# Patient Record
Sex: Female | Born: 1970
Health system: Southern US, Community
[De-identification: ages and names within clinical notes are randomized; demographics above are authoritative.]

## PROBLEM LIST (undated history)

## (undated) DIAGNOSIS — R011 Cardiac murmur, unspecified: Secondary | ICD-10-CM

## (undated) DIAGNOSIS — U071 COVID-19: Secondary | ICD-10-CM

## (undated) DIAGNOSIS — I10 Essential (primary) hypertension: Secondary | ICD-10-CM

## (undated) DIAGNOSIS — O24419 Gestational diabetes mellitus in pregnancy, unspecified control: Secondary | ICD-10-CM

## (undated) HISTORY — DX: Gestational diabetes mellitus in pregnancy, unspecified control: O24.419

## (undated) HISTORY — PX: OTHER SURGICAL HISTORY: SHX169

## (undated) HISTORY — DX: Essential (primary) hypertension: I10

## (undated) HISTORY — DX: Cardiac murmur, unspecified: R01.1

## (undated) HISTORY — PX: ABDOMINAL HYSTERECTOMY: SHX81

## (undated) HISTORY — DX: COVID-19: U07.1

---

## 2004-01-28 ENCOUNTER — Ambulatory Visit: Payer: Self-pay | Admitting: Family Medicine

## 2004-02-01 ENCOUNTER — Ambulatory Visit: Payer: Self-pay | Admitting: Family Medicine

## 2007-09-13 ENCOUNTER — Ambulatory Visit: Payer: Self-pay | Admitting: Obstetrics & Gynecology

## 2008-07-06 ENCOUNTER — Ambulatory Visit: Payer: Self-pay | Admitting: Obstetrics & Gynecology

## 2008-07-12 ENCOUNTER — Ambulatory Visit: Payer: Self-pay | Admitting: Obstetrics & Gynecology

## 2009-11-18 ENCOUNTER — Ambulatory Visit: Payer: Self-pay | Admitting: Obstetrics & Gynecology

## 2015-10-08 ENCOUNTER — Other Ambulatory Visit: Payer: Self-pay | Admitting: Obstetrics & Gynecology

## 2015-10-08 DIAGNOSIS — Z1231 Encounter for screening mammogram for malignant neoplasm of breast: Secondary | ICD-10-CM

## 2015-10-28 ENCOUNTER — Other Ambulatory Visit: Payer: Self-pay | Admitting: Obstetrics & Gynecology

## 2015-10-28 ENCOUNTER — Ambulatory Visit
Admission: RE | Admit: 2015-10-28 | Discharge: 2015-10-28 | Disposition: A | Discharge status: Home / Self Care | Payer: 59 | Source: Ambulatory Visit | Attending: Obstetrics & Gynecology | Admitting: Obstetrics & Gynecology

## 2015-10-28 DIAGNOSIS — Z1231 Encounter for screening mammogram for malignant neoplasm of breast: Secondary | ICD-10-CM | POA: Insufficient documentation

## 2017-10-04 ENCOUNTER — Ambulatory Visit (INDEPENDENT_AMBULATORY_CARE_PROVIDER_SITE_OTHER): Payer: 59 | Admitting: Obstetrics & Gynecology

## 2017-10-04 ENCOUNTER — Encounter: Payer: Self-pay | Admitting: Obstetrics & Gynecology

## 2017-10-04 VITALS — BP 138/90 | Ht 64.0 in | Wt 238.0 lb

## 2017-10-04 DIAGNOSIS — Z13220 Encounter for screening for lipoid disorders: Secondary | ICD-10-CM | POA: Diagnosis not present

## 2017-10-04 DIAGNOSIS — Z1329 Encounter for screening for other suspected endocrine disorder: Secondary | ICD-10-CM | POA: Diagnosis not present

## 2017-10-04 DIAGNOSIS — Z1321 Encounter for screening for nutritional disorder: Secondary | ICD-10-CM

## 2017-10-04 DIAGNOSIS — Z1239 Encounter for other screening for malignant neoplasm of breast: Secondary | ICD-10-CM

## 2017-10-04 DIAGNOSIS — Z131 Encounter for screening for diabetes mellitus: Secondary | ICD-10-CM

## 2017-10-04 DIAGNOSIS — Z1231 Encounter for screening mammogram for malignant neoplasm of breast: Secondary | ICD-10-CM

## 2017-10-04 DIAGNOSIS — Z124 Encounter for screening for malignant neoplasm of cervix: Secondary | ICD-10-CM

## 2017-10-04 DIAGNOSIS — Z6841 Body Mass Index (BMI) 40.0 and over, adult: Secondary | ICD-10-CM | POA: Insufficient documentation

## 2017-10-04 DIAGNOSIS — E66813 Obesity, class 3: Secondary | ICD-10-CM

## 2017-10-04 DIAGNOSIS — Z0000 Encounter for general adult medical examination without abnormal findings: Secondary | ICD-10-CM

## 2017-10-04 MED ORDER — PHENTERMINE HCL 37.5 MG PO TABS: 37.5000 mg | ORAL_TABLET | Freq: Every day | ORAL | 0 refills | Status: DC

## 2017-10-04 NOTE — Patient Instructions (Addendum)
PAP every three years Mammogram every year    Call [phone] to schedule at [place] Colonoscopy every 10 years Labs yearly (with PCP)   Phentermine tablets or capsules What is this medicine? PHENTERMINE (FEN ter meen) decreases your appetite. It is used with a reduced calorie diet and exercise to help you lose weight. This medicine may be used for other purposes; ask your health care provider or pharmacist if you have questions. COMMON BRAND NAME(S): Adipex-P, Atti-Plex P, Atti-Plex P Spansule, Fastin, Lomaira, Pro-Fast, Tara-8 What should I tell my health care provider before I take this medicine? They need to know if you have any of these conditions: -agitation -glaucoma -heart disease -high blood pressure -history of substance abuse -lung disease called Primary Pulmonary Hypertension (PPH) -taken an MAOI like Carbex, Eldepryl, Marplan, Nardil, or Parnate in last 14 days -thyroid disease -an unusual or allergic reaction to phentermine, other medicines, foods, dyes, or preservatives -pregnant or trying to get pregnant -breast-feeding How should I use this medicine? Take this medicine by mouth with a glass of water. Follow the directions on the prescription label. The instructions for use may differ based on the product and dose you are taking. Avoid taking this medicine in the evening. It may interfere with sleep. Take your doses at regular intervals. Do not take your medicine more often than directed. Talk to your pediatrician regarding the use of this medicine in children. While this drug may be prescribed for children 17 years or older for selected conditions, precautions do apply. Overdosage: If you think you have taken too much of this medicine contact a poison control center or emergency room at once. NOTE: This medicine is only for you. Do not share this medicine with others. What if I miss a dose? If you miss a dose, take it as soon as you can. If it is almost time for  your next dose, take only that dose. Do not take double or extra doses. What may interact with this medicine? Do not take this medicine with any of the following medications: -duloxetine -MAOIs like Carbex, Eldepryl, Marplan, Nardil, and Parnate -medicines for colds or breathing difficulties like pseudoephedrine or phenylephrine -procarbazine -sibutramine -SSRIs like citalopram, escitalopram, fluoxetine, fluvoxamine, paroxetine, and sertraline -stimulants like dexmethylphenidate, methylphenidate or modafinil -venlafaxine This medicine may also interact with the following medications: -medicines for diabetes This list may not describe all possible interactions. Give your health care provider a list of all the medicines, herbs, non-prescription drugs, or dietary supplements you use. Also tell them if you smoke, drink alcohol, or use illegal drugs. Some items may interact with your medicine. What should I watch for while using this medicine? Notify your physician immediately if you become short of breath while doing your normal activities. Do not take this medicine within 6 hours of bedtime. It can keep you from getting to sleep. Avoid drinks that contain caffeine and try to stick to a regular bedtime every night. This medicine was intended to be used in addition to a healthy diet and exercise. The best results are achieved this way. This medicine is only indicated for short-term use. Eventually your weight loss may level out. At that point, the drug will only help you maintain your new weight. Do not increase or in any way change your dose without consulting your doctor. You may get drowsy or dizzy. Do not drive, use machinery, or do anything that needs mental alertness until you know how this medicine affects you. Do not stand or sit   up quickly, especially if you are an older patient. This reduces the risk of dizzy or fainting spells. Alcohol may increase dizziness and drowsiness. Avoid alcoholic  drinks. What side effects may I notice from receiving this medicine? Side effects that you should report to your doctor or health care professional as soon as possible: -chest pain, palpitations -depression or severe changes in mood -increased blood pressure -irritability -nervousness or restlessness -severe dizziness -shortness of breath -problems urinating -unusual swelling of the legs -vomiting Side effects that usually do not require medical attention (report to your doctor or health care professional if they continue or are bothersome): -blurred vision or other eye problems -changes in sexual ability or desire -constipation or diarrhea -difficulty sleeping -dry mouth or unpleasant taste -headache -nausea This list may not describe all possible side effects. Call your doctor for medical advice about side effects. You may report side effects to FDA at 1-800-FDA-1088. Where should I keep my medicine? Keep out of the reach of children. This medicine can be abused. Keep your medicine in a safe place to protect it from theft. Do not share this medicine with anyone. Selling or giving away this medicine is dangerous and against the law. This medicine may cause accidental overdose and death if taken by other adults, children, or pets. Mix any unused medicine with a substance like cat litter or coffee grounds. Then throw the medicine away in a sealed container like a sealed bag or a coffee can with a lid. Do not use the medicine after the expiration date. Store at room temperature between 20 and 25 degrees C (68 and 77 degrees F). Keep container tightly closed. NOTE: This sheet is a summary. It may not cover all possible information. If you have questions about this medicine, talk to your doctor, pharmacist, or health care provider.  2018 Elsevier/Gold Standard (2015-01-18 12:53:15)  

## 2017-10-04 NOTE — Progress Notes (Signed)
HPI:      Donna Hinton is a 47 y.o. [id number] who LMP was in the past, she presents today for her annual examination.  The patient has no complaints today. Rare bleeding (has had [facility]).  No night sweats and rare hot flashes (just feels hot all the time). Weight gain also a concern, has been on meds in past w some help; feels now that she is on first shift work at [employer] she will do better.  The patient is sexually active. Herlast pap: approximate date 2016 and was normal and last mammogram: approximate date 2017 and was normal.  The patient does perform self breast exams.  There is notable family history of breast or ovarian cancer in her family. The patient is not taking hormone replacement therapy. Patient denies post-menopausal vaginal bleeding.   The patient has regular exercise: yes. The patient denies current symptoms of depression.    GYN Hx: Last Colonoscopy:never ago. Normal.  Last DEXA: never ago.    PMHx: Past Medical History:  Diagnosis Date  . Gestational diabetes    Past Surgical History:  Procedure Laterality Date  . lsh     Family History  Problem Relation Age of Onset  . Breast cancer Mother 22  . Diabetes Father   . Hypertension Father    Social History   Tobacco Use  . Smoking status: Never Smoker  . Smokeless tobacco: Never Used  Substance Use Topics  . Alcohol use: Never    Frequency: Never  . Drug use: Never    Current Outpatient Medications:  .  phentermine (ADIPEX-P) 37.5 MG tablet, Take 1 tablet (37.5 mg total) by mouth daily before breakfast., Disp: 30 tablet, Rfl: 0 Allergies: Sulfa antibiotics  Review of Systems  Constitutional: Negative for chills, fever and malaise/fatigue.  HENT: Negative for congestion, sinus pain and sore throat.   Eyes: Negative for blurred vision and pain.  Respiratory: Negative for cough and wheezing.   Cardiovascular: Negative for chest pain and leg swelling.  Gastrointestinal: Negative for abdominal pain,  constipation, diarrhea, heartburn, nausea and vomiting.  Genitourinary: Negative for dysuria, frequency, hematuria and urgency.  Musculoskeletal: Negative for back pain, joint pain, myalgias and neck pain.  Skin: Negative for itching and rash.  Neurological: Negative for dizziness, tremors and weakness.  Endo/Heme/Allergies: Does not bruise/bleed easily.  Psychiatric/Behavioral: Negative for depression. The patient is not nervous/anxious and does not have insomnia.     Objective: BP 138/90   Ht 5\' 4"  (1.626 m)   Wt 238 lb (108 kg)   BMI 40.85 kg/m   Filed Weights   10/04/17 0806  Weight: 238 lb (108 kg)   Body mass index is 40.85 kg/m. Physical Exam  Constitutional: She is oriented to person, place, and time. She appears well-developed and well-nourished. No distress.  Genitourinary: Rectum normal and vagina normal. Pelvic exam was performed with patient supine. There is no rash or lesion on the right labia. There is no rash or lesion on the left labia. Vagina exhibits no lesion. No bleeding in the vagina. Right adnexum does not display mass and does not display tenderness. Left adnexum does not display mass and does not display tenderness. Cervix does not exhibit motion tenderness, lesion, friability or polyp.  Genitourinary Comments: Uterus absent No mass  HENT:  Head: Normocephalic and atraumatic. Head is without laceration.  Right Ear: Hearing normal.  Left Ear: Hearing normal.  Nose: No epistaxis.  No foreign bodies.  Mouth/Throat: Uvula is midline, oropharynx  is clear and moist and mucous membranes are normal.  Eyes: Pupils are equal, round, and reactive to light.  Neck: Normal range of motion. Neck supple. No thyromegaly present.  Cardiovascular: Normal rate and regular rhythm. Exam reveals no gallop and no friction rub.  No murmur heard. Pulmonary/Chest: Effort normal and breath sounds normal. No respiratory distress. She has no wheezes. Right breast exhibits no mass, no  skin change and no tenderness. Left breast exhibits no mass, no skin change and no tenderness.  Abdominal: Soft. Bowel sounds are normal. She exhibits no distension. There is no tenderness. There is no rebound.  Musculoskeletal: Normal range of motion.  Neurological: She is alert and oriented to person, place, and time. No cranial nerve deficit.  Skin: Skin is warm and dry.  Psychiatric: She has a normal mood and affect. Judgment normal.  Vitals reviewed.  Assessment: Annual Exam 1. Annual physical exam   2. Screening for cervical cancer   3. Screening for breast cancer   4. Screening for thyroid disorder   5. Screening for diabetes mellitus   6. Screening cholesterol level   7. Encounter for vitamin deficiency screening   8. Obesity, Class III, BMI 40-49.9 (morbid obesity) (HCC)    Plan:            1.  Cervical Screening-  Pap smear done today  2. Breast screening- Exam annually and mammogram scheduled  3.  Labs Ordered today  4. Counseling for hormonal therapy: none  5. Obesity- weight loss strategies discussed.  Meds also discussed as a short term tool to help.  Rx Phentermine today, with f/u one month.    F/U  Return in about 1 month (around 11/01/2017) for Follow up.  Donna MajorPaul Shigeko Manard, MD, Merlinda FrederickFACOG [facility], [facility] 10/04/2017  8:28 AM

## 2017-10-05 ENCOUNTER — Other Ambulatory Visit: Payer: Self-pay | Admitting: Obstetrics & Gynecology

## 2017-10-05 LAB — LIPID PANEL
Chol/HDL Ratio: 6 ratio — ABNORMAL HIGH (ref 0.0–4.4)
Cholesterol, Total: 278 mg/dL — ABNORMAL HIGH (ref 100–199)
HDL: 46 mg/dL (ref >39)
LDL Calculated: 213 mg/dL — ABNORMAL HIGH (ref 0–99)
Triglycerides: 97 mg/dL (ref 0–149)
VLDL Cholesterol Cal: 19 mg/dL (ref 5–40)

## 2017-10-05 LAB — HEMOGLOBIN A1C
ESTIMATED AVERAGE GLUCOSE: 117 mg/dL
Hgb A1c MFr Bld: 5.7 % — ABNORMAL HIGH (ref 4.8–5.6)

## 2017-10-05 LAB — TSH: TSH: 0.513 u[IU]/mL (ref 0.450–4.500)

## 2017-10-05 LAB — VITAMIN D 25 HYDROXY (VIT D DEFICIENCY, FRACTURES): Vit D, 25-Hydroxy: 9.8 ng/mL — ABNORMAL LOW (ref 30.0–100.0)

## 2017-10-05 MED ORDER — VITAMIN D (ERGOCALCIFEROL) 1.25 MG (50000 UNIT) PO CAPS: 50000.0000 [IU] | ORAL_CAPSULE | ORAL | 2 refills | Status: DC

## 2017-10-07 LAB — IGP, APTIMA HPV
HPV APTIMA: NEGATIVE
PAP Smear Comment: 0

## 2017-10-08 ENCOUNTER — Other Ambulatory Visit: Payer: Self-pay | Admitting: Obstetrics & Gynecology

## 2017-10-08 ENCOUNTER — Ambulatory Visit
Admission: RE | Admit: 2017-10-08 | Discharge: 2017-10-08 | Disposition: A | Discharge status: Home / Self Care | Payer: 59 | Source: Ambulatory Visit | Attending: Obstetrics & Gynecology | Admitting: Obstetrics & Gynecology

## 2017-10-08 DIAGNOSIS — Z1231 Encounter for screening mammogram for malignant neoplasm of breast: Secondary | ICD-10-CM | POA: Diagnosis present

## 2017-10-08 DIAGNOSIS — R928 Other abnormal and inconclusive findings on diagnostic imaging of breast: Secondary | ICD-10-CM

## 2017-10-08 DIAGNOSIS — Z1239 Encounter for other screening for malignant neoplasm of breast: Secondary | ICD-10-CM

## 2017-10-08 NOTE — Progress Notes (Signed)
MMG abnormal , needs additional imaging D/w pt, to schedule soon Reassured Annamarie MajorPaul Armiyah Capron, MD, Merlinda FrederickFACOG [facility], [facility] 10/08/2017  1:36 PM

## 2017-10-08 NOTE — Progress Notes (Signed)
Left Dx MMG and US needs to be scheduled, not sure if they automatically do that or not when MMG abnormal like this

## 2017-10-14 ENCOUNTER — Ambulatory Visit
Admission: RE | Admit: 2017-10-14 | Discharge: 2017-10-14 | Disposition: A | Discharge status: Home / Self Care | Payer: 59 | Source: Ambulatory Visit | Attending: Obstetrics & Gynecology | Admitting: Obstetrics & Gynecology

## 2017-10-14 DIAGNOSIS — R928 Other abnormal and inconclusive findings on diagnostic imaging of breast: Secondary | ICD-10-CM | POA: Diagnosis present

## 2017-11-01 ENCOUNTER — Ambulatory Visit (INDEPENDENT_AMBULATORY_CARE_PROVIDER_SITE_OTHER): Payer: 59 | Admitting: Obstetrics & Gynecology

## 2017-11-01 ENCOUNTER — Encounter: Payer: Self-pay | Admitting: Obstetrics & Gynecology

## 2017-11-01 VITALS — BP 148/80 | Ht 64.0 in | Wt 237.0 lb

## 2017-11-01 DIAGNOSIS — Z6841 Body Mass Index (BMI) 40.0 and over, adult: Secondary | ICD-10-CM

## 2017-11-01 NOTE — Progress Notes (Signed)
  History of Present Illness:  Donna Hinton is a 47 y.o. who was started on  Current Outpatient Medications on File Prior to Visit  Medication Sig Dispense Refill  . phentermine (ADIPEX-P) 37.5 MG tablet Take 1 tablet (37.5 mg total) by mouth daily before breakfast. 30 tablet 0  approximately 4 weeks ago due to obesity/abnormal weight gain. The patient has lost 1 pounds over the past 4wks due to lifestyle changes..   She has these side effects: dry mouth. Feels loss of weight in clothes and feels well.  PMHx: She  has a past medical history of Gestational diabetes. Also,  has a past surgical history that includes lsh and Abdominal hysterectomy., family history includes Breast cancer (age of onset: 4865) in her mother; Diabetes in her father; Hypertension in her father.,  reports that she has never smoked. She has never used smokeless tobacco. She reports that she does not drink alcohol or use drugs.  She has a current medication list which includes the following prescription(s): phentermine and vitamin d (ergocalciferol). Also, is allergic to sulfa antibiotics.  Review of Systems  All other systems reviewed and are negative.  Physical Exam:  BP (!) 148/80   Ht 5\' 4"  (1.626 m)   Wt 237 lb (107.5 kg)   BMI 40.68 kg/m  Body mass index is 40.68 kg/m. Filed Weights   11/01/17 1632  Weight: 237 lb (107.5 kg)   Physical Exam  Constitutional: She is oriented to person, place, and time. She appears well-developed and well-nourished. No distress.  Musculoskeletal: Normal range of motion.  Neurological: She is alert and oriented to person, place, and time.  Skin: Skin is warm and dry.  Psychiatric: She has a normal mood and affect.  Vitals reviewed.  Assessment: obesity Medication treatment is going adequately for her.  Plan: Patient is continued/added to prescription appetite suppressants: Phentermine and self-directed dieting.   Will continue to assist patient in incorporating positive  experiences into her life to promote a positive mental attitude.  Education given regarding appropriate lifestyle changes for weight loss, including regular physical activity, healthy coping strategies, caloric restriction, and healthy eating patterns.  The risks and benefits as well as side effects of medication, such as Phenteramine or Tenuate, is discussed.  The pros and cons of suppressing appetite and boosting metabolism is counseled.  Risks of tolerance and addiction discussed.  Use of medicine will be short term.  Pt to call with any negative side effects and agrees to keep follow up appointments.  A total of 15 minutes were spent face-to-face with the patient during this encounter and over half of that time dealt with counseling and coordination of care.  Annamarie MajorPaul Rachael Zapanta, MD, Merlinda FrederickFACOG [facility], [facility] 11/01/2017  5:16 PM

## 2017-11-03 ENCOUNTER — Other Ambulatory Visit: Payer: Self-pay | Admitting: Obstetrics & Gynecology

## 2017-11-04 ENCOUNTER — Other Ambulatory Visit: Payer: Self-pay | Admitting: Obstetrics & Gynecology

## 2017-11-04 MED ORDER — PHENTERMINE HCL 37.5 MG PO TABS: 37.5000 mg | ORAL_TABLET | Freq: Every day | ORAL | 0 refills | Status: DC

## 2017-11-25 ENCOUNTER — Encounter: Payer: Self-pay | Admitting: Obstetrics & Gynecology

## 2017-11-25 ENCOUNTER — Other Ambulatory Visit: Payer: Self-pay | Admitting: Obstetrics & Gynecology

## 2017-11-25 MED ORDER — PHENTERMINE HCL 37.5 MG PO TABS: 37.5000 mg | ORAL_TABLET | Freq: Every day | ORAL | 0 refills | Status: DC

## 2017-11-25 NOTE — Telephone Encounter (Signed)
Please advise 

## 2017-12-23 ENCOUNTER — Other Ambulatory Visit: Payer: Self-pay | Admitting: Obstetrics & Gynecology

## 2017-12-23 NOTE — Telephone Encounter (Signed)
Please advise 

## 2018-01-03 ENCOUNTER — Ambulatory Visit (INDEPENDENT_AMBULATORY_CARE_PROVIDER_SITE_OTHER): Payer: 59 | Admitting: Obstetrics & Gynecology

## 2018-01-03 ENCOUNTER — Encounter: Payer: Self-pay | Admitting: Obstetrics & Gynecology

## 2018-01-03 MED ORDER — PHENTERMINE HCL 37.5 MG PO TABS: 37.5000 mg | ORAL_TABLET | Freq: Every day | ORAL | 1 refills | Status: DC

## 2018-01-03 NOTE — Progress Notes (Signed)
  History of Present Illness:  Donna Hinton is a 47 y.o. who was started on  Phentermine approximately 3 months ago due to obesity/abnormal weight gain. The patient has lost 5 pounds over the past 2 mos due to meds and change in job shift/activity..   She has these side effects: none.  PMHx: She  has a past medical history of Gestational diabetes. Also,  has a past surgical history that includes lsh and Abdominal hysterectomy., family history includes Breast cancer (age of onset: 71) in her mother; Diabetes in her father; Hypertension in her father.,  reports that she has never smoked. She has never used smokeless tobacco. She reports that she does not drink alcohol or use drugs.  She has a current medication list which includes the following prescription(s): phentermine and vitamin d (ergocalciferol). Also, is allergic to sulfa antibiotics.  Review of Systems  All other systems reviewed and are negative.  Physical Exam:  BP 130/80   Ht 5\' 4"  (1.626 m)   Wt 232 lb (105.2 kg)   BMI 39.82 kg/m  Body mass index is 39.82 kg/m. Filed Weights   01/03/18 1600  Weight: 232 lb (105.2 kg)   Physical Exam  Constitutional: She is oriented to person, place, and time. She appears well-developed and well-nourished. No distress.  Musculoskeletal: Normal range of motion.  Neurological: She is alert and oriented to person, place, and time.  Skin: Skin is warm and dry.  Psychiatric: She has a normal mood and affect.  Vitals reviewed.  Assessment: morbid obesity Medication treatment is going well for her.  Plan: Patient is continued/added to prescription appetite suppressants: Phentermine  Consider Contrave after comes off Phentermine (in 2 mos) to maintain, if starts to gain  Will continue to assist patient in incorporating positive experiences into her life to promote a positive mental attitude.  Education given regarding appropriate lifestyle changes for weight loss, including regular physical  activity, healthy coping strategies, caloric restriction, and healthy eating patterns.  The risks and benefits as well as side effects of medication, such as Phenteramine or Tenuate, is discussed.  The pros and cons of suppressing appetite and boosting metabolism is counseled.  Risks of tolerance and addiction discussed.  Use of medicine will be short term.  Pt to call with any negative side effects and agrees to keep follow up appointments.  Patient doing well with weight loss in progress. Will stop medicine in 2 mos and allow for a drug-free holiday. Patient understands she may need future therapy if weight gain resumes or she does not reach her weight loss goals on her own with good diet and exercise habits.   A total of 15 minutes were spent face-to-face with the patient during this encounter and over half of that time dealt with counseling and coordination of care.  Annamarie Major, MD, [facility], [facility] 01/03/2018  4:17 PM

## 2018-01-16 ENCOUNTER — Other Ambulatory Visit: Payer: Self-pay | Admitting: Obstetrics & Gynecology

## 2018-02-09 ENCOUNTER — Other Ambulatory Visit: Payer: Self-pay | Admitting: Obstetrics & Gynecology

## 2018-02-09 DIAGNOSIS — Z1321 Encounter for screening for nutritional disorder: Secondary | ICD-10-CM

## 2018-02-09 NOTE — Telephone Encounter (Signed)
does pt need labs first ?

## 2018-02-09 NOTE — Telephone Encounter (Signed)
She needs to revert to Vit D 1000 U per day over the counter.  She can have level checked anytime to see if she would need renewal of higher dose Rx.  Order in place for lab.

## 2018-12-19 ENCOUNTER — Other Ambulatory Visit: Payer: Self-pay

## 2018-12-20 ENCOUNTER — Encounter: Payer: Self-pay | Admitting: Internal Medicine

## 2018-12-20 ENCOUNTER — Ambulatory Visit (INDEPENDENT_AMBULATORY_CARE_PROVIDER_SITE_OTHER): Payer: 59 | Admitting: Internal Medicine

## 2018-12-20 ENCOUNTER — Other Ambulatory Visit: Payer: Self-pay | Admitting: Internal Medicine

## 2018-12-20 DIAGNOSIS — Z1329 Encounter for screening for other suspected endocrine disorder: Secondary | ICD-10-CM | POA: Diagnosis not present

## 2018-12-20 DIAGNOSIS — I10 Essential (primary) hypertension: Secondary | ICD-10-CM

## 2018-12-20 DIAGNOSIS — Z1231 Encounter for screening mammogram for malignant neoplasm of breast: Secondary | ICD-10-CM

## 2018-12-20 DIAGNOSIS — Z1389 Encounter for screening for other disorder: Secondary | ICD-10-CM | POA: Diagnosis not present

## 2018-12-20 DIAGNOSIS — E559 Vitamin D deficiency, unspecified: Secondary | ICD-10-CM | POA: Insufficient documentation

## 2018-12-20 DIAGNOSIS — R739 Hyperglycemia, unspecified: Secondary | ICD-10-CM

## 2018-12-20 MED ORDER — AMLODIPINE BESYLATE 2.5 MG PO TABS: 2.5000 mg | ORAL_TABLET | Freq: Every day | ORAL | 3 refills | Status: DC

## 2018-12-20 NOTE — Progress Notes (Signed)
Virtual Visit via Video Note  I connected with Donna Hinton  on 12/20/18 at 10:10 AM EDT by a video enabled telemedicine application and verified that I am speaking with the correct person using two identifiers.  Location patient: home Location provider:work or home office Persons participating in the virtual visit: patient, provider  I discussed the limitations of evaluation and management by telemedicine and the availability of in person appointments. The patient expressed understanding and agreed to proceed.   HPI: 1. HTN not on meds at times getting h/a and dizzy in the am with FH HTN  2. B/l breast cysts and due mammogram  3. Obesity not currently working out was on adipex P but raised BP    ROS: See pertinent positives and negatives per HPI. General: wt stable HEENt: no sore throat  CV: no chest pain  Lungs: no sob  GI: no ab pain  GU: no issues  MSK: no issues  SkiN: no issues  Neuro: +intermittent h/a  Psych: no anxiety/depresssion    Past Medical History:  Diagnosis Date  . Gestational diabetes   . HTN (hypertension)     Past Surgical History:  Procedure Laterality Date  . ABDOMINAL HYSTERECTOMY     ovaries intact had 2011/2012 due to fibroids/DUB  . lsh      Family History  Problem Relation Age of Onset  . Breast cancer Mother 107  . Dementia Mother   . Diabetes Father   . Hypertension Father   . Diabetes Other        maternal family   . Hypertension Sister     SOCIAL HX: married with 2 kids [employer] employee   Current Outpatient Medications:  .  BIOTIN PO, Take by mouth., Disp: , Rfl:  .  amLODipine (NORVASC) 2.5 MG tablet, Take 1 tablet (2.5 mg total) by mouth daily., Disp: 90 tablet, Rfl: 3  EXAM:  VITALS per patient if applicable:  GENERAL: alert, oriented, appears well and in no acute distress  HEENT: atraumatic, conjunttiva clear, no obvious abnormalities on inspection of external nose and ears  NECK: normal movements of the head and  neck  LUNGS: on inspection no signs of respiratory distress, breathing rate appears normal, no obvious gross SOB, gasping or wheezing  CV: no obvious cyanosis  MS: moves all visible extremities without noticeable abnormality  PSYCH/NEURO: pleasant and cooperative, no obvious depression or anxiety, speech and thought processing grossly intact  ASSESSMENT AND PLAN:  Discussed the following assessment and plan:  Essential hypertension - Plan: amLODipine (NORVASC) 2.5 MG tablet, Comprehensive metabolic panel, CBC with Differential/Platelet, Lipid panel   Hyperglycemia - Plan: Hemoglobin A1c  Vitamin D deficiency - Plan: Vitamin D (25 hydroxy)  HM Flu shot at work  Tdap sch 01/09/19  Consider shingrix in future   mammo referred h/o breast cyst  Pap 10/04/17 negative Dr. Kenton [facility] ob/gyn  Colonoscopy [facility] pt wants to wait until 04/2019  Skin no issues   Fasting labs [facility] asap    I discussed the assessment and treatment plan with the patient. The patient was provided an opportunity to ask questions and all were answered. The patient agreed with the plan and demonstrated an understanding of the instructions.   The patient was advised to call back or seek an in-person evaluation if the symptoms worsen or if the condition fails to improve as anticipated.  Time spent 30 minutes Delorise Jackson, MD

## 2018-12-20 NOTE — Patient Instructions (Signed)
Exercising to Lose Weight Exercise is structured, repetitive physical activity to improve fitness and health. Getting regular exercise is important for everyone. It is especially important if you are overweight. Being overweight increases your risk of heart disease, stroke, diabetes, high blood pressure, and several types of cancer. Reducing your calorie intake and exercising can help you lose weight. Exercise is usually categorized as moderate or vigorous intensity. To lose weight, most people need to do a certain amount of moderate-intensity or vigorous-intensity exercise each week. Moderate-intensity exercise  Moderate-intensity exercise is any activity that gets you moving enough to burn at least three times more energy (calories) than if you were sitting. Examples of moderate exercise include:  Walking a mile in 15 minutes.  Doing light yard work.  Biking at an easy pace. Most people should get at least 150 minutes (2 hours and 30 minutes) a week of moderate-intensity exercise to maintain their body weight. Vigorous-intensity exercise Vigorous-intensity exercise is any activity that gets you moving enough to burn at least six times more calories than if you were sitting. When you exercise at this intensity, you should be working hard enough that you are not able to carry on a conversation. Examples of vigorous exercise include:  Running.  Playing a team sport, such as football, basketball, and soccer.  Jumping rope. Most people should get at least 75 minutes (1 hour and 15 minutes) a week of vigorous-intensity exercise to maintain their body weight. How can exercise affect me? When you exercise enough to burn more calories than you eat, you lose weight. Exercise also reduces body fat and builds muscle. The more muscle you have, the more calories you burn. Exercise also:  Improves mood.  Reduces stress and tension.  Improves your overall fitness, flexibility, and  endurance.  Increases bone strength. The amount of exercise you need to lose weight depends on:  Your age.  The type of exercise.  Any health conditions you have.  Your overall physical ability. Talk to your health care provider about how much exercise you need and what types of activities are safe for you. What actions can I take to lose weight? Nutrition   Make changes to your diet as told by your health care provider or diet and nutrition specialist (dietitian). This may include: ? Eating fewer calories. ? Eating more protein. ? Eating less unhealthy fats. ? Eating a diet that includes fresh fruits and vegetables, whole grains, low-fat dairy products, and lean protein. ? Avoiding foods with added fat, salt, and sugar.  Drink plenty of water while you exercise to prevent dehydration or heat stroke. Activity  Choose an activity that you enjoy and set realistic goals. Your health care provider can help you make an exercise plan that works for you.  Exercise at a moderate or vigorous intensity most days of the week. ? The intensity of exercise may vary from person to person. You can tell how intense a workout is for you by paying attention to your breathing and heartbeat. Most people will notice their breathing and heartbeat get faster with more intense exercise.  Do resistance training twice each week, such as: ? Push-ups. ? Sit-ups. ? Lifting weights. ? Using resistance bands.  Getting short amounts of exercise can be just as helpful as long structured periods of exercise. If you have trouble finding time to exercise, try to include exercise in your daily routine. ? Get up, stretch, and walk around every 30 minutes throughout the day. ? Go for a  walk during your lunch break. ? Park your car farther away from your destination. ? If you take public transportation, get off one stop early and walk the rest of the way. ? Make phone calls while standing up and walking  around. ? Take the stairs instead of elevators or escalators.  Wear comfortable clothes and shoes with good support.  Do not exercise so much that you hurt yourself, feel dizzy, or get very short of breath. Where to find more information  [place] Department of Health and Human Services: BondedCompany.at  Centers for Disease Control and Prevention (CDC): http://www.wolf.info/ Contact a health care provider:  Before starting a new exercise program.  If you have questions or concerns about your weight.  If you have a medical problem that keeps you from exercising. Get help right away if you have any of the following while exercising:  Injury.  Dizziness.  Difficulty breathing or shortness of breath that does not go away when you stop exercising.  Chest pain.  Rapid heartbeat. Summary  Being overweight increases your risk of heart disease, stroke, diabetes, high blood pressure, and several types of cancer.  Losing weight happens when you burn more calories than you eat.  Reducing the amount of calories you eat in addition to getting regular moderate or vigorous exercise each week helps you lose weight. This information is not intended to replace advice given to you by your health care provider. Make sure you discuss any questions you have with your health care provider. Document Released: 05/16/2010 Document Revised: 04/26/2017 Document Reviewed: 04/26/2017 Elsevier Patient Education  2020 [employer] DASH stands for "Dietary Approaches to Stop Hypertension." The DASH eating plan is a healthy eating plan that has been shown to reduce high blood pressure (hypertension). It may also reduce your risk for type 2 diabetes, heart disease, and stroke. The DASH eating plan may also help with weight loss. What are tips for following this plan?  General guidelines  Avoid eating more than 2,300 mg (milligrams) of salt (sodium) a day. If you have hypertension, you may need to reduce  your sodium intake to 1,500 mg a day.  Limit alcohol intake to no more than 1 drink a day for nonpregnant women and 2 drinks a day for men. One drink equals 12 oz of beer, 5 oz of wine, or 1 oz of hard liquor.  Work with your health care provider to maintain a healthy body weight or to lose weight. Ask what an ideal weight is for you.  Get at least 30 minutes of exercise that causes your heart to beat faster (aerobic exercise) most days of the week. Activities may include walking, swimming, or biking.  Work with your health care provider or diet and nutrition specialist (dietitian) to adjust your eating plan to your individual calorie needs. Reading food labels   Check food labels for the amount of sodium per serving. Choose foods with less than 5 percent of the Daily Value of sodium. Generally, foods with less than 300 mg of sodium per serving fit into this eating plan.  To find whole grains, look for the word "whole" as the first word in the ingredient list. Shopping  Buy products labeled as "low-sodium" or "no salt added."  Buy fresh foods. Avoid canned foods and premade or frozen meals. Cooking  Avoid adding salt when cooking. Use salt-free seasonings or herbs instead of table salt or sea salt. Check with your health care provider or pharmacist before  using salt substitutes. °· Do not fry foods. Cook foods using healthy methods such as baking, boiling, grilling, and broiling instead. °· Cook with heart-healthy oils, such as olive, canola, soybean, or sunflower oil. °Meal planning °· Eat a balanced diet that includes: °? 5 or more servings of fruits and vegetables each day. At each meal, try to fill half of your plate with fruits and vegetables. °? Up to 6-8 servings of whole grains each day. °? Less than 6 oz of lean meat, poultry, or fish each day. A 3-oz serving of meat is about the same size as a deck of cards. One egg equals 1 oz. °? 2 servings of low-fat dairy each day. °? A serving  of nuts, seeds, or beans 5 times each week. °? Heart-healthy fats. Healthy fats called Omega-3 fatty acids are found in foods such as flaxseeds and coldwater fish, like sardines, salmon, and mackerel. °· Limit how much you eat of the following: °? Canned or prepackaged foods. °? Food that is high in trans fat, such as fried foods. °? Food that is high in saturated fat, such as fatty meat. °? Sweets, desserts, sugary drinks, and other foods with added sugar. °? Full-fat dairy products. °· Do not salt foods before eating. °· Try to eat at least 2 vegetarian meals each week. °· Eat more home-cooked food and less restaurant, buffet, and fast food. °· When eating at a restaurant, ask that your food be prepared with less salt or no salt, if possible. °What foods are recommended? °The items listed may not be a complete list. Talk with your dietitian about what dietary choices are best for you. °Grains °Whole-grain or whole-wheat bread. Whole-grain or whole-wheat pasta. Brown rice. Oatmeal. Quinoa. Bulgur. Whole-grain and low-sodium cereals. Pita bread. Low-fat, low-sodium crackers. Whole-wheat flour tortillas. °Vegetables °Fresh or frozen vegetables (raw, steamed, roasted, or grilled). Low-sodium or reduced-sodium tomato and vegetable juice. Low-sodium or reduced-sodium tomato sauce and tomato paste. Low-sodium or reduced-sodium canned vegetables. °Fruits °All fresh, dried, or frozen fruit. Canned fruit in natural juice (without added sugar). °Meat and other protein foods °Skinless chicken or turkey. Ground chicken or turkey. Pork with fat trimmed off. Fish and seafood. Egg whites. Dried beans, peas, or lentils. Unsalted nuts, nut butters, and seeds. Unsalted canned beans. Lean cuts of beef with fat trimmed off. Low-sodium, lean deli meat. °Dairy °Low-fat (1%) or fat-free (skim) milk. Fat-free, low-fat, or reduced-fat cheeses. Nonfat, low-sodium ricotta or cottage cheese. Low-fat or nonfat yogurt. Low-fat, low-sodium  cheese. °Fats and oils °Soft margarine without trans fats. Vegetable oil. Low-fat, reduced-fat, or light mayonnaise and salad dressings (reduced-sodium). Canola, safflower, olive, soybean, and sunflower oils. Avocado. °Seasoning and other foods °Herbs. Spices. Seasoning mixes without salt. Unsalted popcorn and pretzels. Fat-free sweets. °What foods are not recommended? °The items listed may not be a complete list. Talk with your dietitian about what dietary choices are best for you. °Grains °Baked goods made with fat, such as croissants, muffins, or some breads. Dry pasta or rice meal packs. °Vegetables °Creamed or fried vegetables. Vegetables in a cheese sauce. Regular canned vegetables (not low-sodium or reduced-sodium). Regular canned tomato sauce and paste (not low-sodium or reduced-sodium). Regular tomato and vegetable juice (not low-sodium or reduced-sodium). Pickles. Olives. °Fruits °Canned fruit in a light or heavy syrup. Fried fruit. Fruit in cream or butter sauce. °Meat and other protein foods °Fatty cuts of meat. Ribs. Fried meat. Bacon. Sausage. Bologna and other processed lunch meats. Salami. Fatback. Hotdogs. Bratwurst. Salted nuts and   seeds. Canned beans with added salt. Canned or smoked fish. Whole eggs or egg yolks. Chicken or [place] with skin. Dairy Whole or 2% milk, cream, and half-and-half. Whole or full-fat cream cheese. Whole-fat or sweetened yogurt. Full-fat cheese. Nondairy creamers. Whipped toppings. Processed cheese and cheese spreads. Fats and oils Butter. Stick margarine. Lard. Shortening. Ghee. Bacon fat. Tropical oils, such as coconut, palm kernel, or palm oil. Seasoning and other foods Salted popcorn and pretzels. Onion salt, garlic salt, seasoned salt, table salt, and sea salt. Worcestershire sauce. Tartar sauce. Barbecue sauce. Teriyaki sauce. Soy sauce, including reduced-sodium. Steak sauce. Canned and packaged gravies. Fish sauce. Oyster sauce. Cocktail sauce. Horseradish  that you find on the shelf. Ketchup. Mustard. Meat flavorings and tenderizers. Bouillon cubes. Hot sauce and Tabasco sauce. Premade or packaged marinades. Premade or packaged taco seasonings. Relishes. Regular salad dressings. Where to find more information:  National Heart, Lung, and Blood Institute: PopSteam.is  American Heart Association: www.heart.org Summary  The DASH eating plan is a healthy eating plan that has been shown to reduce high blood pressure (hypertension). It may also reduce your risk for type 2 diabetes, heart disease, and stroke.  With the DASH eating plan, you should limit salt (sodium) intake to 2,300 mg a day. If you have hypertension, you may need to reduce your sodium intake to 1,500 mg a day.  When on the DASH eating plan, aim to eat more fresh fruits and vegetables, whole grains, lean proteins, low-fat dairy, and heart-healthy fats.  Work with your health care provider or diet and nutrition specialist (dietitian) to adjust your eating plan to your individual calorie needs. This information is not intended to replace advice given to you by your health care provider. Make sure you discuss any questions you have with your health care provider. Document Released: 04/02/2011 Document Revised: 03/26/2017 Document Reviewed: 04/06/2016 Elsevier Patient Education  2020 [employer].  Hypertension, Adult High blood pressure (hypertension) is when the force of blood pumping through the arteries is too strong. The arteries are the blood vessels that carry blood from the heart throughout the body. Hypertension forces the heart to work harder to pump blood and may cause arteries to become narrow or stiff. Untreated or uncontrolled hypertension can cause a heart attack, heart failure, a stroke, kidney disease, and other problems. A blood pressure reading consists of a higher number over a lower number. Ideally, your blood pressure should be below 120/80. The first ("top")  number is called the systolic pressure. It is a measure of the pressure in your arteries as your heart beats. The second ("bottom") number is called the diastolic pressure. It is a measure of the pressure in your arteries as the heart relaxes. What are the causes? The exact cause of this condition is not known. There are some conditions that result in or are related to high blood pressure. What increases the risk? Some risk factors for high blood pressure are under your control. The following factors may make you more likely to develop this condition:  Smoking.  Having type 2 diabetes mellitus, high cholesterol, or both.  Not getting enough exercise or physical activity.  Being overweight.  Having too much fat, sugar, calories, or salt (sodium) in your diet.  Drinking too much alcohol. Some risk factors for high blood pressure may be difficult or impossible to change. Some of these factors include:  Having chronic kidney disease.  Having a family history of high blood pressure.  Age. Risk increases with age.  Race. You may be at higher risk if you are African American.  Gender. Men are at higher risk than women before age 50. After age 50, women are at higher risk than men.  Having obstructive sleep apnea.  Stress. What are the signs or symptoms? High blood pressure may not cause symptoms. Very high blood pressure (hypertensive crisis) may cause:  Headache.  Anxiety.  Shortness of breath.  Nosebleed.  Nausea and vomiting.  Vision changes.  Severe chest pain.  Seizures. How is this diagnosed? This condition is diagnosed by measuring your blood pressure while you are seated, with your arm resting on a flat surface, your legs uncrossed, and your feet flat on the floor. The cuff of the blood pressure monitor will be placed directly against the skin of your upper arm at the level of your heart. It should be measured at least twice using the same arm. Certain conditions  can cause a difference in blood pressure between your right and left arms. Certain factors can cause blood pressure readings to be lower or higher than normal for a short period of time:  When your blood pressure is higher when you are in a health care provider's office than when you are at home, this is called white coat hypertension. Most people with this condition do not need medicines.  When your blood pressure is higher at home than when you are in a health care provider's office, this is called masked hypertension. Most people with this condition may need medicines to control blood pressure. If you have a high blood pressure reading during one visit or you have normal blood pressure with other risk factors, you may be asked to:  Return on a different day to have your blood pressure checked again.  Monitor your blood pressure at home for 1 week or longer. If you are diagnosed with hypertension, you may have other blood or imaging tests to help your health care provider understand your overall risk for other conditions. How is this treated? This condition is treated by making healthy lifestyle changes, such as eating healthy foods, exercising more, and reducing your alcohol intake. Your health care provider may prescribe medicine if lifestyle changes are not enough to get your blood pressure under control, and if:  Your systolic blood pressure is above 130.  Your diastolic blood pressure is above 80. Your personal target blood pressure may vary depending on your medical conditions, your age, and other factors. Follow these instructions at home: Eating and drinking   Eat a diet that is high in fiber and potassium, and low in sodium, added sugar, and fat. An example eating plan is called the DASH (Dietary Approaches to Stop Hypertension) diet. To eat this way: ? Eat plenty of fresh fruits and vegetables. Try to fill one half of your plate at each meal with fruits and vegetables. ? Eat  whole grains, such as whole-wheat pasta, brown rice, or whole-grain bread. Fill about one fourth of your plate with whole grains. ? Eat or drink low-fat dairy products, such as skim milk or low-fat yogurt. ? Avoid fatty cuts of meat, processed or cured meats, and poultry with skin. Fill about one fourth of your plate with lean proteins, such as fish, chicken without skin, beans, eggs, or tofu. ? Avoid pre-made and processed foods. These tend to be higher in sodium, added sugar, and fat.  Reduce your daily sodium intake. Most people with hypertension should eat less than 1,500 mg of sodium a day.  Do not drink alcohol if: ? Your health care provider tells you not to drink. ? You are pregnant, may be pregnant, or are planning to become pregnant.  If you drink alcohol: ? Limit how much you use to:  0-1 drink a day for women.  0-2 drinks a day for men. ? Be aware of how much alcohol is in your drink. In the [place] one drink equals one 12 oz bottle of beer (355 mL), one 5 oz glass of wine (148 mL), or one 1 oz glass of hard liquor (44 mL). Lifestyle   Work with your health care provider to maintain a healthy body weight or to lose weight. Ask what an ideal weight is for you.  Get at least 30 minutes of exercise most days of the week. Activities may include walking, swimming, or biking.  Include exercise to strengthen your muscles (resistance exercise), such as Pilates or lifting weights, as part of your weekly exercise routine. Try to do these types of exercises for 30 minutes at least 3 days a week.  Do not use any products that contain nicotine or tobacco, such as cigarettes, e-cigarettes, and chewing tobacco. If you need help quitting, ask your health care provider.  Monitor your blood pressure at home as told by your health care provider.  Keep all follow-up visits as told by your health care provider. This is important. Medicines  Take over-the-counter and prescription medicines  only as told by your health care provider. Follow directions carefully. Blood pressure medicines must be taken as prescribed.  Do not skip doses of blood pressure medicine. Doing this puts you at risk for problems and can make the medicine less effective.  Ask your health care provider about side effects or reactions to medicines that you should watch for. Contact a health care provider if you:  Think you are having a reaction to a medicine you are taking.  Have headaches that keep coming back (recurring).  Feel dizzy.  Have swelling in your ankles.  Have trouble with your vision. Get help right away if you:  Develop a severe headache or confusion.  Have unusual weakness or numbness.  Feel faint.  Have severe pain in your chest or abdomen.  Vomit repeatedly.  Have trouble breathing. Summary  Hypertension is when the force of blood pumping through your arteries is too strong. If this condition is not controlled, it may put you at risk for serious complications.  Your personal target blood pressure may vary depending on your medical conditions, your age, and other factors. For most people, a normal blood pressure is less than 120/80.  Hypertension is treated with lifestyle changes, medicines, or a combination of both. Lifestyle changes include losing weight, eating a healthy, low-sodium diet, exercising more, and limiting alcohol. This information is not intended to replace advice given to you by your health care provider. Make sure you discuss any questions you have with your health care provider. Document Released: 04/13/2005 Document Revised: 12/22/2017 Document Reviewed: 12/22/2017 Elsevier Patient Education  2020 [employer]

## 2018-12-24 LAB — URINALYSIS, ROUTINE W REFLEX MICROSCOPIC
Bilirubin, UA: NEGATIVE
Glucose, UA: NEGATIVE
Ketones, UA: NEGATIVE
Leukocytes,UA: NEGATIVE
Nitrite, UA: NEGATIVE
Protein,UA: NEGATIVE
RBC, UA: NEGATIVE
Specific Gravity, UA: 1.021 (ref 1.005–1.030)
Urobilinogen, Ur: 0.2 mg/dL (ref 0.2–1.0)
pH, UA: 6 (ref 5.0–7.5)

## 2018-12-24 LAB — COMPREHENSIVE METABOLIC PANEL
ALT: 10 IU/L (ref 0–32)
AST: 13 IU/L (ref 0–40)
Albumin/Globulin Ratio: 1.5 (ref 1.2–2.2)
Albumin: 4.1 g/dL (ref 3.8–4.8)
Alkaline Phosphatase: 75 IU/L (ref 39–117)
BUN/Creatinine Ratio: 14 (ref 9–23)
BUN: 11 mg/dL (ref 6–24)
Bilirubin Total: 0.3 mg/dL (ref 0.0–1.2)
CO2: 22 mmol/L (ref 20–29)
Calcium: 9.1 mg/dL (ref 8.7–10.2)
Chloride: 103 mmol/L (ref 96–106)
Creatinine, Ser: 0.79 mg/dL (ref 0.57–1.00)
GFR calc Af Amer: 101 mL/min/{1.73_m2} (ref >59)
GFR calc non Af Amer: 88 mL/min/{1.73_m2} (ref >59)
Globulin, Total: 2.7 g/dL (ref 1.5–4.5)
Glucose: 97 mg/dL (ref 65–99)
Potassium: 4.5 mmol/L (ref 3.5–5.2)
Sodium: 141 mmol/L (ref 134–144)
Total Protein: 6.8 g/dL (ref 6.0–8.5)

## 2018-12-24 LAB — CBC WITH DIFFERENTIAL/PLATELET
Basophils Absolute: 0 10*3/uL (ref 0.0–0.2)
Basos: 0 %
EOS (ABSOLUTE): 0.2 10*3/uL (ref 0.0–0.4)
Eos: 2 %
Hematocrit: 42.4 % (ref 34.0–46.6)
Hemoglobin: 14.1 g/dL (ref 11.1–15.9)
Immature Grans (Abs): 0 10*3/uL (ref 0.0–0.1)
Immature Granulocytes: 0 %
Lymphocytes Absolute: 3.9 10*3/uL — ABNORMAL HIGH (ref 0.7–3.1)
Lymphs: 44 %
MCH: 28.6 pg (ref 26.6–33.0)
MCHC: 33.3 g/dL (ref 31.5–35.7)
MCV: 86 fL (ref 79–97)
Monocytes Absolute: 0.7 10*3/uL (ref 0.1–0.9)
Monocytes: 7 %
Neutrophils Absolute: 4.1 10*3/uL (ref 1.4–7.0)
Neutrophils: 47 %
Platelets: 307 10*3/uL (ref 150–450)
RBC: 4.93 x10E6/uL (ref 3.77–5.28)
RDW: 13.2 % (ref 11.7–15.4)
WBC: 8.9 10*3/uL (ref 3.4–10.8)

## 2018-12-24 LAB — LIPID PANEL
Chol/HDL Ratio: 6.9 ratio — ABNORMAL HIGH (ref 0.0–4.4)
Cholesterol, Total: 296 mg/dL — ABNORMAL HIGH (ref 100–199)
HDL: 43 mg/dL (ref >39)
LDL Calculated: 229 mg/dL — ABNORMAL HIGH (ref 0–99)
Triglycerides: 119 mg/dL (ref 0–149)
VLDL Cholesterol Cal: 24 mg/dL (ref 5–40)

## 2018-12-24 LAB — VITAMIN D 25 HYDROXY (VIT D DEFICIENCY, FRACTURES): Vit D, 25-Hydroxy: 13.1 ng/mL — ABNORMAL LOW (ref 30.0–100.0)

## 2018-12-24 LAB — HEMOGLOBIN A1C
Est. average glucose Bld gHb Est-mCnc: 111 mg/dL
Hgb A1c MFr Bld: 5.5 % (ref 4.8–5.6)

## 2018-12-24 LAB — TSH: TSH: 0.694 u[IU]/mL (ref 0.450–4.500)

## 2018-12-26 ENCOUNTER — Encounter: Payer: Self-pay | Admitting: Internal Medicine

## 2018-12-26 ENCOUNTER — Other Ambulatory Visit: Payer: Self-pay | Admitting: Internal Medicine

## 2018-12-26 DIAGNOSIS — E559 Vitamin D deficiency, unspecified: Secondary | ICD-10-CM

## 2018-12-26 DIAGNOSIS — E785 Hyperlipidemia, unspecified: Secondary | ICD-10-CM | POA: Insufficient documentation

## 2018-12-26 MED ORDER — CHOLECALCIFEROL 1.25 MG (50000 UT) PO CAPS: 50000.0000 [IU] | ORAL_CAPSULE | ORAL | 1 refills | Status: DC

## 2018-12-27 ENCOUNTER — Encounter: Payer: Self-pay | Admitting: Internal Medicine

## 2018-12-27 ENCOUNTER — Other Ambulatory Visit: Payer: Self-pay | Admitting: Internal Medicine

## 2018-12-27 DIAGNOSIS — E785 Hyperlipidemia, unspecified: Secondary | ICD-10-CM

## 2018-12-27 MED ORDER — ATORVASTATIN CALCIUM 10 MG PO TABS: 10.0000 mg | ORAL_TABLET | Freq: Every day | ORAL | 3 refills | Status: DC

## 2018-12-29 ENCOUNTER — Telehealth: Payer: Self-pay | Admitting: *Deleted

## 2018-12-29 NOTE — Telephone Encounter (Signed)
Please place future orders for lab appt.  

## 2018-12-30 NOTE — Telephone Encounter (Signed)
Please cancel lab appt she had labs [facility] 12/23/18  Should only be coming this day for vaccine   Thanks [place]

## 2019-01-04 ENCOUNTER — Encounter: Payer: Self-pay | Admitting: Internal Medicine

## 2019-01-09 ENCOUNTER — Other Ambulatory Visit: Payer: Self-pay

## 2019-01-09 ENCOUNTER — Other Ambulatory Visit: Payer: 59

## 2019-01-09 ENCOUNTER — Other Ambulatory Visit: Payer: Self-pay | Admitting: Internal Medicine

## 2019-01-09 ENCOUNTER — Ambulatory Visit (INDEPENDENT_AMBULATORY_CARE_PROVIDER_SITE_OTHER): Payer: 59

## 2019-01-09 DIAGNOSIS — I10 Essential (primary) hypertension: Secondary | ICD-10-CM

## 2019-01-09 DIAGNOSIS — Z23 Encounter for immunization: Secondary | ICD-10-CM | POA: Diagnosis not present

## 2019-01-09 MED ORDER — AMLODIPINE BESYLATE 5 MG PO TABS: 5.0000 mg | ORAL_TABLET | Freq: Every day | ORAL | 3 refills | Status: DC

## 2019-01-12 ENCOUNTER — Other Ambulatory Visit: Payer: Self-pay | Admitting: Internal Medicine

## 2019-01-12 DIAGNOSIS — I10 Essential (primary) hypertension: Secondary | ICD-10-CM

## 2019-01-12 MED ORDER — AMLODIPINE BESYLATE 5 MG PO TABS: 5.0000 mg | ORAL_TABLET | Freq: Every day | ORAL | 3 refills | Status: DC

## 2019-01-23 ENCOUNTER — Ambulatory Visit
Admission: RE | Admit: 2019-01-23 | Discharge: 2019-01-23 | Disposition: A | Discharge status: Home / Self Care | Payer: 59 | Source: Ambulatory Visit | Attending: Internal Medicine | Admitting: Internal Medicine

## 2019-01-23 DIAGNOSIS — Z1231 Encounter for screening mammogram for malignant neoplasm of breast: Secondary | ICD-10-CM

## 2019-02-01 ENCOUNTER — Ambulatory Visit: Payer: 59 | Admitting: Internal Medicine

## 2019-02-14 ENCOUNTER — Other Ambulatory Visit: Payer: Self-pay

## 2019-02-14 ENCOUNTER — Encounter: Payer: Self-pay | Admitting: Internal Medicine

## 2019-02-14 ENCOUNTER — Ambulatory Visit (INDEPENDENT_AMBULATORY_CARE_PROVIDER_SITE_OTHER): Payer: 59 | Admitting: Internal Medicine

## 2019-02-14 VITALS — BP 136/76 | HR 84 | Temp 98.2°F | Ht 64.0 in | Wt 246.0 lb

## 2019-02-14 DIAGNOSIS — I10 Essential (primary) hypertension: Secondary | ICD-10-CM

## 2019-02-14 DIAGNOSIS — Z1231 Encounter for screening mammogram for malignant neoplasm of breast: Secondary | ICD-10-CM | POA: Insufficient documentation

## 2019-02-14 DIAGNOSIS — Z0000 Encounter for general adult medical examination without abnormal findings: Secondary | ICD-10-CM | POA: Diagnosis not present

## 2019-02-14 DIAGNOSIS — G4733 Obstructive sleep apnea (adult) (pediatric): Secondary | ICD-10-CM

## 2019-02-14 DIAGNOSIS — E785 Hyperlipidemia, unspecified: Secondary | ICD-10-CM

## 2019-02-14 DIAGNOSIS — R0683 Snoring: Secondary | ICD-10-CM | POA: Insufficient documentation

## 2019-02-14 DIAGNOSIS — Z23 Encounter for immunization: Secondary | ICD-10-CM | POA: Diagnosis not present

## 2019-02-14 DIAGNOSIS — Z1211 Encounter for screening for malignant neoplasm of colon: Secondary | ICD-10-CM

## 2019-02-14 DIAGNOSIS — Z9989 Dependence on other enabling machines and devices: Secondary | ICD-10-CM | POA: Insufficient documentation

## 2019-02-14 DIAGNOSIS — L918 Other hypertrophic disorders of the skin: Secondary | ICD-10-CM | POA: Insufficient documentation

## 2019-02-14 DIAGNOSIS — H6123 Impacted cerumen, bilateral: Secondary | ICD-10-CM

## 2019-02-14 DIAGNOSIS — E559 Vitamin D deficiency, unspecified: Secondary | ICD-10-CM

## 2019-02-14 NOTE — Patient Instructions (Addendum)
[facility] [address] [place]  [phone] [id number]  Debrox ear wax drops 4-7 days 1x per month    Results for Donna Hinton, Donna Hinton (MRN [id number]) as of 02/14/2019 16:18  Ref. Range 10/04/2017 08:26 12/23/2018 07:14  Cholesterol, Total Latest Ref Range: 100 - 199 mg/dL 409 (H) 811 (H)  HDL Cholesterol Latest Ref Range: >39 mg/dL 46 43  LDL (calc) Latest Ref Range: 0 - 99 mg/dL 914 (H) 782 (H)  Triglycerides Latest Ref Range: 0 - 149 mg/dL 97 956  VLDL Cholesterol Cal Latest Ref Range: 5 - 40 mg/dL 19 24    Consider home sleep study   Sleep Apnea Sleep apnea is a condition in which breathing pauses or becomes shallow during sleep. Episodes of sleep apnea usually last 10 seconds or longer, and they may occur as many as 20 times an hour. Sleep apnea disrupts your sleep and keeps your body from getting the rest that it needs. This condition can increase your risk of certain health problems, including:  Heart attack.  Stroke.  Obesity.  Diabetes.  Heart failure.  Irregular heartbeat. What are the causes? There are three kinds of sleep apnea:  Obstructive sleep apnea. This kind is caused by a blocked or collapsed airway.  Central sleep apnea. This kind happens when the part of the brain that controls breathing does not send the correct signals to the muscles that control breathing.  Mixed sleep apnea. This is a combination of obstructive and central sleep apnea. The most common cause of this condition is a collapsed or blocked airway. An airway can collapse or become blocked if:  Your throat muscles are abnormally relaxed.  Your tongue and tonsils are larger than normal.  You are overweight.  Your airway is smaller than normal. What increases the risk? You are more likely to develop this condition if you:  Are overweight.  Smoke.  Have a smaller than normal airway.  Are elderly.  Are female.  Drink alcohol.  Take sedatives or  tranquilizers.  Have a family history of sleep apnea. What are the signs or symptoms? Symptoms of this condition include:  Trouble staying asleep.  Daytime sleepiness and tiredness.  Irritability.  Loud snoring.  Morning headaches.  Trouble concentrating.  Forgetfulness.  Decreased interest in sex.  Unexplained sleepiness.  Mood swings.  Personality changes.  Feelings of depression.  Waking up often during the night to urinate.  Dry mouth.  Sore throat. How is this diagnosed? This condition may be diagnosed with:  A medical history.  A physical exam.  A series of tests that are done while you are sleeping (sleep study). These tests are usually done in a sleep lab, but they may also be done at home. How is this treated? Treatment for this condition aims to restore normal breathing and to ease symptoms during sleep. It may involve managing health issues that can affect breathing, such as high blood pressure or obesity. Treatment may include:  Sleeping on your side.  Using a decongestant if you have nasal congestion.  Avoiding the use of depressants, including alcohol, sedatives, and narcotics.  Losing weight if you are overweight.  Making changes to your diet.  Quitting smoking.  Using a device to open your airway while you sleep, such as: ? An oral appliance. This is a custom-made mouthpiece that shifts your lower jaw forward. ? A continuous positive airway pressure (CPAP) device. This device blows air through a mask when you breathe out (  exhale). ? A nasal expiratory positive airway pressure (EPAP) device. This device has valves that you put into each nostril. ? A bi-level positive airway pressure (BPAP) device. This device blows air through a mask when you breathe in (inhale) and breathe out (exhale).  Having surgery if other treatments do not work. During surgery, excess tissue is removed to create a wider airway. It is important to get treatment for  sleep apnea. Without treatment, this condition can lead to:  High blood pressure.  Coronary artery disease.  In men, an inability to achieve or maintain an erection (impotence).  Reduced thinking abilities. Follow these instructions at home: Lifestyle  Make any lifestyle changes that your health care provider recommends.  Eat a healthy, well-balanced diet.  Take steps to lose weight if you are overweight.  Avoid using depressants, including alcohol, sedatives, and narcotics.  Do not use any products that contain nicotine or tobacco, such as cigarettes, e-cigarettes, and chewing tobacco. If you need help quitting, ask your health care provider. General instructions  Take over-the-counter and prescription medicines only as told by your health care provider.  If you were given a device to open your airway while you sleep, use it only as told by your health care provider.  If you are having surgery, make sure to tell your health care provider you have sleep apnea. You may need to bring your device with you.  Keep all follow-up visits as told by your health care provider. This is important. Contact a health care provider if:  The device that you received to open your airway during sleep is uncomfortable or does not seem to be working.  Your symptoms do not improve.  Your symptoms get worse. Get help right away if:  You develop: ? Chest pain. ? Shortness of breath. ? Discomfort in your back, arms, or stomach.  You have: ? Trouble speaking. ? Weakness on one side of your body. ? Drooping in your face. These symptoms may represent a serious problem that is an emergency. Do not wait to see if the symptoms will go away. Get medical help right away. Call your local emergency services (911 in the [place] Do not drive yourself to the hospital. Summary  Sleep apnea is a condition in which breathing pauses or becomes shallow during sleep.  The most common cause is a collapsed or  blocked airway.  The goal of treatment is to restore normal breathing and to ease symptoms during sleep. This information is not intended to replace advice given to you by your health care provider. Make sure you discuss any questions you have with your health care provider. Document Released: 04/03/2002 Document Revised: 01/28/2018 Document Reviewed: 12/07/2017 Elsevier Patient Education  2020 [employer].   Cholesterol Content in Foods Cholesterol is a waxy, fat-like substance that helps to carry fat in the blood. The body needs cholesterol in small amounts, but too much cholesterol can cause damage to the arteries and heart. Most people should eat less than 200 milligrams (mg) of cholesterol a day. Foods with cholesterol  Cholesterol is found in animal-based foods, such as meat, seafood, and dairy. Generally, low-fat dairy and lean meats have less cholesterol than full-fat dairy and fatty meats. The milligrams of cholesterol per serving (mg per serving) of common cholesterol-containing foods are listed below. Meat and other proteins  Egg -- one large whole egg has 186 mg.  Veal shank -- 4 oz has 141 mg.  Lean ground [place] (93% lean) -- 4 oz  has 118 mg.  Fat-trimmed lamb loin -- 4 oz has 106 mg.  Lean ground beef (90% lean) -- 4 oz has 100 mg.  Lobster -- 3.5 oz has 90 mg.  Pork loin chops -- 4 oz has 86 mg.  Canned salmon -- 3.5 oz has 83 mg.  Fat-trimmed beef top loin -- 4 oz has 78 mg.  Frankfurter -- 1 frank (3.5 oz) has 77 mg.  Crab -- 3.5 oz has 71 mg.  Roasted chicken without skin, white meat -- 4 oz has 66 mg.  Light bologna -- 2 oz has 45 mg.  Deli-cut [place] -- 2 oz has 31 mg.  Canned tuna -- 3.5 oz has 31 mg.  Berniece Salines -- 1 oz has 29 mg.  Oysters and mussels (raw) -- 3.5 oz has 25 mg.  Mackerel -- 1 oz has 22 mg.  Trout -- 1 oz has 20 mg.  Pork sausage -- 1 link (1 oz) has 17 mg.  Salmon -- 1 oz has 16 mg.  Tilapia -- 1 oz has 14  mg. Dairy  Soft-serve ice cream --  cup (4 oz) has 103 mg.  Whole-milk yogurt -- 1 cup (8 oz) has 29 mg.  Cheddar cheese -- 1 oz has 28 mg.  American cheese -- 1 oz has 28 mg.  Whole milk -- 1 cup (8 oz) has 23 mg.  2% milk -- 1 cup (8 oz) has 18 mg.  Cream cheese -- 1 tablespoon (Tbsp) has 15 mg.  Cottage cheese --  cup (4 oz) has 14 mg.  Low-fat (1%) milk -- 1 cup (8 oz) has 10 mg.  Sour cream -- 1 Tbsp has 8.5 mg.  Low-fat yogurt -- 1 cup (8 oz) has 8 mg.  Nonfat Greek yogurt -- 1 cup (8 oz) has 7 mg.  Half-and-half cream -- 1 Tbsp has 5 mg. Fats and oils  Cod liver oil -- 1 tablespoon (Tbsp) has 82 mg.  Butter -- 1 Tbsp has 15 mg.  Lard -- 1 Tbsp has 14 mg.  Bacon grease -- 1 Tbsp has 14 mg.  Mayonnaise -- 1 Tbsp has 5-10 mg.  Margarine -- 1 Tbsp has 3-10 mg. Exact amounts of cholesterol in these foods may vary depending on specific ingredients and brands. Foods without cholesterol Most plant-based foods do not have cholesterol unless you combine them with a food that has cholesterol. Foods without cholesterol include:  Grains and cereals.  Vegetables.  Fruits.  Vegetable oils, such as olive, canola, and sunflower oil.  Legumes, such as peas, beans, and lentils.  Nuts and seeds.  Egg whites. Summary  The body needs cholesterol in small amounts, but too much cholesterol can cause damage to the arteries and heart.  Most people should eat less than 200 milligrams (mg) of cholesterol a day. This information is not intended to replace advice given to you by your health care provider. Make sure you discuss any questions you have with your health care provider. Document Released: 12/08/2016 Document Revised: 03/26/2017 Document Reviewed: 12/08/2016 Elsevier Patient Education  [place].  High Cholesterol  High cholesterol is a condition in which the blood has high levels of a white, waxy, fat-like substance (cholesterol). The human body  needs small amounts of cholesterol. The liver makes all the cholesterol that the body needs. Extra (excess) cholesterol comes from the food that we eat. Cholesterol is carried from the liver by the blood through the blood vessels. If you have high cholesterol, deposits (  plaques) may build up on the walls of your blood vessels (arteries). Plaques make the arteries narrower and stiffer. Cholesterol plaques increase your risk for heart attack and stroke. Work with your health care provider to keep your cholesterol levels in a healthy range. What increases the risk? This condition is more likely to develop in people who:  Eat foods that are high in animal fat (saturated fat) or cholesterol.  Are overweight.  Are not getting enough exercise.  Have a family history of high cholesterol. What are the signs or symptoms? There are no symptoms of this condition. How is this diagnosed? This condition may be diagnosed from the results of a blood test.  If you are older than age 70, your health care provider may check your cholesterol every 4-6 years.  You may be checked more often if you already have high cholesterol or other risk factors for heart disease. The blood test for cholesterol measures:  "Bad" cholesterol (LDL cholesterol). This is the main type of cholesterol that causes heart disease. The desired level for LDL is less than 100.  "Good" cholesterol (HDL cholesterol). This type helps to protect against heart disease by cleaning the arteries and carrying the LDL away. The desired level for HDL is 60 or higher.  Triglycerides. These are fats that the body can store or burn for energy. The desired number for triglycerides is lower than 150.  Total cholesterol. This is a measure of the total amount of cholesterol in your blood, including LDL cholesterol, HDL cholesterol, and triglycerides. A healthy number is less than 200. How is this treated? This condition is treated with diet changes,  lifestyle changes, and medicines. Diet changes  This may include eating more whole grains, fruits, vegetables, nuts, and fish.  This may also include cutting back on red meat and foods that have a lot of added sugar. Lifestyle changes  Changes may include getting at least 40 minutes of aerobic exercise 3 times a week. Aerobic exercises include walking, biking, and swimming. Aerobic exercise along with a healthy diet can help you maintain a healthy weight.  Changes may also include quitting smoking. Medicines  Medicines are usually given if diet and lifestyle changes have failed to reduce your cholesterol to healthy levels.  Your health care provider may prescribe a statin medicine. Statin medicines have been shown to reduce cholesterol, which can reduce the risk of heart disease. Follow these instructions at home: Eating and drinking If told by your health care provider:  Eat chicken (without skin), fish, veal, shellfish, ground [place] breast, and round or loin cuts of red meat.  Do not eat fried foods or fatty meats, such as hot dogs and salami.  Eat plenty of fruits, such as apples.  Eat plenty of vegetables, such as broccoli, potatoes, and carrots.  Eat beans, peas, and lentils.  Eat grains such as barley, rice, couscous, and bulgur wheat.  Eat pasta without cream sauces.  Use skim or nonfat milk, and eat low-fat or nonfat yogurt and cheeses.  Do not eat or drink whole milk, cream, ice cream, egg yolks, or hard cheeses.  Do not eat stick margarine or tub margarines that contain trans fats (also called partially hydrogenated oils).  Do not eat saturated tropical oils, such as coconut oil and palm oil.  Do not eat cakes, cookies, crackers, or other baked goods that contain trans fats.  General instructions  Exercise as directed by your health care provider. Increase your activity level with activities such  as gardening, walking, and taking the stairs.  Take  over-the-counter and prescription medicines only as told by your health care provider.  Do not use any products that contain nicotine or tobacco, such as cigarettes and e-cigarettes. If you need help quitting, ask your health care provider.  Keep all follow-up visits as told by your health care provider. This is important. Contact a health care provider if:  You are struggling to maintain a healthy diet or weight.  You need help to start on an exercise program.  You need help to stop smoking. Get help right away if:  You have chest pain.  You have trouble breathing. This information is not intended to replace advice given to you by your health care provider. Make sure you discuss any questions you have with your health care provider. Document Released: 04/13/2005 Document Revised: 04/16/2017 Document Reviewed: 10/12/2015 Elsevier Patient Education  2020 Else[employer] Inc.   Recombinant Zoster (Shingles) Vaccine: What You Need to Know 1. Why get vaccinated? Recombinant zoster (shingles) vaccine can prevent shingles. Shingles (also called herpes zoster, or just zoster) is a painful skin rash, usually with blisters. In addition to the rash, shingles can cause fever, headache, chills, or upset stomach. More rarely, shingles can lead to pneumonia, hearing problems, blindness, brain inflammation (encephalitis), or death. The most common complication of shingles is long-term nerve pain called postherpetic neuralgia (PHN). PHN occurs in the areas where the shingles rash was, even after the rash clears up. It can last for months or years after the rash goes away. The pain from PHN can be severe and debilitating. About 10 to 18% of people who get shingles will experience PHN. The risk of PHN increases with age. An older adult with shingles is more likely to develop PHN and have longer lasting and more severe pain than a younger person with shingles. Shingles is caused by the varicella zoster virus, the  same virus that causes chickenpox. After you have chickenpox, the virus stays in your body and can cause shingles later in life. Shingles cannot be passed from one person to another, but the virus that causes shingles can spread and cause chickenpox in someone who had never had chickenpox or received chickenpox vaccine. 2. Recombinant shingles vaccine Recombinant shingles vaccine provides strong protection against shingles. By preventing shingles, recombinant shingles vaccine also protects against PHN. Recombinant shingles vaccine is the preferred vaccine for the prevention of shingles. However, a different vaccine, live shingles vaccine, may be used in some circumstances. The recombinant shingles vaccine is recommended for adults 50 years and older without serious immune problems. It is given as a two-dose series. This vaccine is also recommended for people who have already gotten another type of shingles vaccine, the live shingles vaccine. There is no live virus in this vaccine. Shingles vaccine may be given at the same time as other vaccines. 3. Talk with your health care provider Tell your vaccine provider if the person getting the vaccine:  Has had an allergic reaction after a previous dose of recombinant shingles vaccine, or has any severe, life-threatening allergies.  Is pregnant or breastfeeding.  Is currently experiencing an episode of shingles. In some cases, your health care provider may decide to postpone shingles vaccination to a future visit. People with minor illnesses, such as a cold, may be vaccinated. People who are moderately or severely ill should usually wait until they recover before getting recombinant shingles vaccine. Your health care provider can give you more information. 4. Risks of  a vaccine reaction  A sore arm with mild or moderate pain is very common after recombinant shingles vaccine, affecting about 80% of vaccinated people. Redness and swelling can also happen  at the site of the injection.  Tiredness, muscle pain, headache, shivering, fever, stomach pain, and nausea happen after vaccination in more than half of people who receive recombinant shingles vaccine. In clinical trials, about 1 out of 6 people who got recombinant zoster vaccine experienced side effects that prevented them from doing regular activities. Symptoms usually went away on their own in 2 to 3 days. You should still get the second dose of recombinant zoster vaccine even if you had one of these reactions after the first dose. People sometimes faint after medical procedures, including vaccination. Tell your provider if you feel dizzy or have vision changes or ringing in the ears. As with any medicine, there is a very remote chance of a vaccine causing a severe allergic reaction, other serious injury, or death. 5. What if there is a serious problem? An allergic reaction could occur after the vaccinated person leaves the clinic. If you see signs of a severe allergic reaction (hives, swelling of the face and throat, difficulty breathing, a fast heartbeat, dizziness, or weakness), call 9-1-1 and get the person to the nearest hospital. For other signs that concern you, call your health care provider. Adverse reactions should be reported to the Vaccine Adverse Event Reporting System (VAERS). Your health care provider will usually file this report, or you can do it yourself. Visit the VAERS website at www.vaers.LAgents.no or call 7038715197. VAERS is only for reporting reactions, and VAERS staff do not give medical advice. 6. How can I learn more?  Ask your health care provider.  Call your local or state health department.  Contact the Centers for Disease Control and Prevention (CDC): ? Call 450-773-8185 (1-800-CDC-INFO) or ? Visit CDC's website at PicCapture.uy Vaccine Information Statement Recombinant Zoster Vaccine (02/23/2018) This information is not intended to replace advice  given to you by your health care provider. Make sure you discuss any questions you have with your health care provider. Document Released: 06/23/2016 Document Revised: 08/02/2018 Document Reviewed: 11/17/2017 Elsevier Patient Education  2020 [employer] Inc.   Earwax Buildup, Adult The ears produce a substance called earwax that helps keep bacteria out of the ear and protects the skin in the ear canal. Occasionally, earwax can build up in the ear and cause discomfort or hearing loss. What increases the risk? This condition is more likely to develop in people who:  Are female.  Are elderly.  Naturally produce more earwax.  Clean their ears often with cotton swabs.  Use earplugs often.  Use in-ear headphones often.  Wear hearing aids.  Have narrow ear canals.  Have earwax that is overly thick or sticky.  Have eczema.  Are dehydrated.  Have excess hair in the ear canal. What are the signs or symptoms? Symptoms of this condition include:  Reduced or muffled hearing.  A feeling of fullness in the ear or feeling that the ear is plugged.  Fluid coming from the ear.  Ear pain.  Ear itch.  Ringing in the ear.  Coughing.  An obvious piece of earwax that can be seen inside the ear canal. How is this diagnosed? This condition may be diagnosed based on:  Your symptoms.  Your medical history.  An ear exam. During the exam, your health care provider will look into your ear with an instrument called an otoscope. You  may have tests, including a hearing test. How is this treated? This condition may be treated by:  Using ear drops to soften the earwax.  Having the earwax removed by a health care provider. The health care provider may: ? Flush the ear with water. ? Use an instrument that has a loop on the end (curette). ? Use a suction device.  Surgery to remove the wax buildup. This may be done in severe cases. Follow these instructions at home:   Take  over-the-counter and prescription medicines only as told by your health care provider.  Do not put any objects, including cotton swabs, into your ear. You can clean the opening of your ear canal with a washcloth or facial tissue.  Follow instructions from your health care provider about cleaning your ears. Do not over-clean your ears.  Drink enough fluid to keep your urine clear or pale yellow. This will help to thin the earwax.  Keep all follow-up visits as told by your health care provider. If earwax builds up in your ears often or if you use hearing aids, consider seeing your health care provider for routine, preventive ear cleanings. Ask your health care provider how often you should schedule your cleanings.  If you have hearing aids, clean them according to instructions from the manufacturer and your health care provider. Contact a health care provider if:  You have ear pain.  You develop a fever.  You have blood, pus, or other fluid coming from your ear.  You have hearing loss.  You have ringing in your ears that does not go away.  Your symptoms do not improve with treatment.  You feel like the room is spinning (vertigo). Summary  Earwax can build up in the ear and cause discomfort or hearing loss.  The most common symptoms of this condition include reduced or muffled hearing and a feeling of fullness in the ear or feeling that the ear is plugged.  This condition may be diagnosed based on your symptoms, your medical history, and an ear exam.  This condition may be treated by using ear drops to soften the earwax or by having the earwax removed by a health care provider.  Do not put any objects, including cotton swabs, into your ear. You can clean the opening of your ear canal with a washcloth or facial tissue. This information is not intended to replace advice given to you by your health care provider. Make sure you discuss any questions you have with your health care  provider. Document Released: 05/21/2004 Document Revised: 03/26/2017 Document Reviewed: 06/24/2016 Elsevier Patient Education  2020 [employer] Inc.   DASH Eating Plan DASH stands for "Dietary Approaches to Stop Hypertension." The DASH eating plan is a healthy eating plan that has been shown to reduce high blood pressure (hypertension). It may also reduce your risk for type 2 diabetes, heart disease, and stroke. The DASH eating plan may also help with weight loss. What are tips for following this plan?  General guidelines  Avoid eating more than 2,300 mg (milligrams) of salt (sodium) a day. If you have hypertension, you may need to reduce your sodium intake to 1,500 mg a day.  Limit alcohol intake to no more than 1 drink a day for nonpregnant women and 2 drinks a day for men. One drink equals 12 oz of beer, 5 oz of wine, or 1 oz of hard liquor.  Work with your health care provider to maintain a healthy body weight or to lose  weight. Ask what an ideal weight is for you.  Get at least 30 minutes of exercise that causes your heart to beat faster (aerobic exercise) most days of the week. Activities may include walking, swimming, or biking.  Work with your health care provider or diet and nutrition specialist (dietitian) to adjust your eating plan to your individual calorie needs. Reading food labels   Check food labels for the amount of sodium per serving. Choose foods with less than 5 percent of the Daily Value of sodium. Generally, foods with less than 300 mg of sodium per serving fit into this eating plan.  To find whole grains, look for the word "whole" as the first word in the ingredient list. Shopping  Buy products labeled as "low-sodium" or "no salt added."  Buy fresh foods. Avoid canned foods and premade or frozen meals. Cooking  Avoid adding salt when cooking. Use salt-free seasonings or herbs instead of table salt or sea salt. Check with your health care provider or pharmacist  before using salt substitutes.  Do not fry foods. Cook foods using healthy methods such as baking, boiling, grilling, and broiling instead.  Cook with heart-healthy oils, such as olive, canola, soybean, or sunflower oil. Meal planning  Eat a balanced diet that includes: ? 5 or more servings of fruits and vegetables each day. At each meal, try to fill half of your plate with fruits and vegetables. ? Up to 6-8 servings of whole grains each day. ? Less than 6 oz of lean meat, poultry, or fish each day. A 3-oz serving of meat is about the same size as a deck of cards. One egg equals 1 oz. ? 2 servings of low-fat dairy each day. ? A serving of nuts, seeds, or beans 5 times each week. ? Heart-healthy fats. Healthy fats called Omega-3 fatty acids are found in foods such as flaxseeds and coldwater fish, like sardines, salmon, and mackerel.  Limit how much you eat of the following: ? Canned or prepackaged foods. ? Food that is high in trans fat, such as fried foods. ? Food that is high in saturated fat, such as fatty meat. ? Sweets, desserts, sugary drinks, and other foods with added sugar. ? Full-fat dairy products.  Do not salt foods before eating.  Try to eat at least 2 vegetarian meals each week.  Eat more home-cooked food and less restaurant, buffet, and fast food.  When eating at a restaurant, ask that your food be prepared with less salt or no salt, if possible. What foods are recommended? The items listed may not be a complete list. Talk with your dietitian about what dietary choices are best for you. Grains Whole-grain or whole-wheat bread. Whole-grain or whole-wheat pasta. Brown rice. Orpah Cobbatmeal. Quinoa. Bulgur. Whole-grain and low-sodium cereals. Pita bread. Low-fat, low-sodium crackers. Whole-wheat flour tortillas. Vegetables Fresh or frozen vegetables (raw, steamed, roasted, or grilled). Low-sodium or reduced-sodium tomato and vegetable juice. Low-sodium or reduced-sodium tomato  sauce and tomato paste. Low-sodium or reduced-sodium canned vegetables. Fruits All fresh, dried, or frozen fruit. Canned fruit in natural juice (without added sugar). Meat and other protein foods Skinless chicken or [place]. Ground chicken or [place]. Pork with fat trimmed off. Fish and seafood. Egg whites. Dried beans, peas, or lentils. Unsalted nuts, nut butters, and seeds. Unsalted canned beans. Lean cuts of beef with fat trimmed off. Low-sodium, lean deli meat. Dairy Low-fat (1%) or fat-free (skim) milk. Fat-free, low-fat, or reduced-fat cheeses. Nonfat, low-sodium ricotta or cottage cheese. Low-fat or nonfat  yogurt. Low-fat, low-sodium cheese. Fats and oils Soft margarine without trans fats. Vegetable oil. Low-fat, reduced-fat, or light mayonnaise and salad dressings (reduced-sodium). Canola, safflower, olive, soybean, and sunflower oils. Avocado. Seasoning and other foods Herbs. Spices. Seasoning mixes without salt. Unsalted popcorn and pretzels. Fat-free sweets. What foods are not recommended? The items listed may not be a complete list. Talk with your dietitian about what dietary choices are best for you. Grains Baked goods made with fat, such as croissants, muffins, or some breads. Dry pasta or rice meal packs. Vegetables Creamed or fried vegetables. Vegetables in a cheese sauce. Regular canned vegetables (not low-sodium or reduced-sodium). Regular canned tomato sauce and paste (not low-sodium or reduced-sodium). Regular tomato and vegetable juice (not low-sodium or reduced-sodium). Rosita Fire. Olives. Fruits Canned fruit in a light or heavy syrup. Fried fruit. Fruit in cream or butter sauce. Meat and other protein foods Fatty cuts of meat. Ribs. Fried meat. Tomasa Blase. Sausage. Bologna and other processed lunch meats. Salami. Fatback. Hotdogs. Bratwurst. Salted nuts and seeds. Canned beans with added salt. Canned or smoked fish. Whole eggs or egg yolks. Chicken or [place] with skin. Dairy Whole  or 2% milk, cream, and half-and-half. Whole or full-fat cream cheese. Whole-fat or sweetened yogurt. Full-fat cheese. Nondairy creamers. Whipped toppings. Processed cheese and cheese spreads. Fats and oils Butter. Stick margarine. Lard. Shortening. Ghee. Bacon fat. Tropical oils, such as coconut, palm kernel, or palm oil. Seasoning and other foods Salted popcorn and pretzels. Onion salt, garlic salt, seasoned salt, table salt, and sea salt. Worcestershire sauce. Tartar sauce. Barbecue sauce. Teriyaki sauce. Soy sauce, including reduced-sodium. Steak sauce. Canned and packaged gravies. Fish sauce. Oyster sauce. Cocktail sauce. Horseradish that you find on the shelf. Ketchup. Mustard. Meat flavorings and tenderizers. Bouillon cubes. Hot sauce and Tabasco sauce. Premade or packaged marinades. Premade or packaged taco seasonings. Relishes. Regular salad dressings. Where to find more information:  National Heart, Lung, and Blood Institute: PopSteam.is  American Heart Association: www.heart.org Summary  The DASH eating plan is a healthy eating plan that has been shown to reduce high blood pressure (hypertension). It may also reduce your risk for type 2 diabetes, heart disease, and stroke.  With the DASH eating plan, you should limit salt (sodium) intake to 2,300 mg a day. If you have hypertension, you may need to reduce your sodium intake to 1,500 mg a day.  When on the DASH eating plan, aim to eat more fresh fruits and vegetables, whole grains, lean proteins, low-fat dairy, and heart-healthy fats.  Work with your health care provider or diet and nutrition specialist (dietitian) to adjust your eating plan to your individual calorie needs. This information is not intended to replace advice given to you by your health care provider. Make sure you discuss any questions you have with your health care provider. Document Released: 04/02/2011 Document Revised: 03/26/2017 Document Reviewed:  04/06/2016 Elsevier Patient Education  2020 [employer].

## 2019-02-14 NOTE — Progress Notes (Addendum)
Chief Complaint  Patient presents with  . Follow-up   Annual 1. HTN BP elevated 142/86 repeat 136/76 not getting h/a or dizziness like before  2. HLD + on lipitor 10 mg qhs tolerating will repeat in future  3. B/l ears feel full with wax  4. Skin tag x years left lower back increased in size and rubbing on clothes and irritated  5. Wants flu shot today  6. She does report snoring at night and is agreeable to sleep study    Review of Systems  Constitutional: Negative for weight loss.  HENT: Negative for hearing loss.   Eyes: Negative for blurred vision.  Respiratory: Negative for shortness of breath.   Cardiovascular: Negative for chest pain.  Gastrointestinal: Negative for abdominal pain.  Musculoskeletal: Negative for falls.  Skin: Negative for rash.       +left lower back skin tag   Neurological: Negative for dizziness and headaches.  Psychiatric/Behavioral: Negative for depression.   Past Medical History:  Diagnosis Date  . Gestational diabetes   . HTN (hypertension)    Past Surgical History:  Procedure Laterality Date  . ABDOMINAL HYSTERECTOMY     ovaries intact had 2011/2012 due to fibroids/DUB  . lsh     Family History  Problem Relation Age of Onset  . Breast cancer Mother 86  . Dementia Mother   . Diabetes Father   . Hypertension Father   . Diabetes Other        maternal family   . Hypertension Sister    Social History   Socioeconomic History  . Marital status: Married    Spouse name: Not on file  . Number of children: Not on file  . Years of education: Not on file  . Highest education level: Not on file  Occupational History  . Not on file  Social Needs  . Financial resource strain: Not on file  . Food insecurity    Worry: Not on file    Inability: Not on file  . Transportation needs    Medical: Not on file    Non-medical: Not on file  Tobacco Use  . Smoking status: Never Smoker  . Smokeless tobacco: Never Used  Substance and Sexual  Activity  . Alcohol use: Never    Frequency: Never  . Drug use: Never  . Sexual activity: Yes  Lifestyle  . Physical activity    Days per week: Not on file    Minutes per session: Not on file  . Stress: Not on file  Relationships  . Social Musician on phone: Not on file    Gets together: Not on file    Attends religious service: Not on file    Active member of club or organization: Not on file    Attends meetings of clubs or organizations: Not on file    Relationship status: Not on file  . Intimate partner violence    Fear of current or ex partner: Not on file    Emotionally abused: Not on file    Physically abused: Not on file    Forced sexual activity: Not on file  Other Topics Concern  . 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 amLODipine (NORVASC) 5 MG tablet Take 1 tablet (5 mg total) by mouth daily.  Marland Kitchen atorvastatin (LIPITOR) 10 MG tablet Take 1 tablet (10 mg total) by mouth daily at 6 PM.  . BIOTIN PO Take by mouth.  . Cholecalciferol 1.25 MG (50000 UT) capsule Take 1 capsule (50,000 Units total) by mouth once a week.   Allergies  Allergen Reactions  . Sulfa Antibiotics     Hives    Recent Results (from the past 2160 hour(s))  Comprehensive metabolic panel     Status: None   Collection Time: 12/23/18  7:14 AM  Result Value Ref Range   Glucose 97 65 - 99 mg/dL   BUN 11 6 - 24 mg/dL   Creatinine, Ser 4.13 0.57 - 1.00 mg/dL   GFR calc non Af Amer 88 >59 mL/min/1.73   GFR calc Af Amer 101 >59 mL/min/1.73   BUN/Creatinine Ratio 14 9 - 23   Sodium 141 134 - 144 mmol/L   Potassium 4.5 3.5 - 5.2 mmol/L   Chloride 103 96 - 106 mmol/L   CO2 22 20 - 29 mmol/L   Calcium 9.1 8.7 - 10.2 mg/dL   Total Protein 6.8 6.0 - 8.5 g/dL    Albumin 4.1 3.8 - 4.8 g/dL   Globulin, Total 2.7 1.5 - 4.5 g/dL   Albumin/Globulin Ratio 1.5 1.2 - 2.2   Bilirubin Total 0.3 0.0 - 1.2 mg/dL   Alkaline Phosphatase 75 39 - 117 IU/L   AST 13 0 - 40 IU/L   ALT 10 0 - 32 IU/L  CBC with Differential/Platelet     Status: Abnormal   Collection Time: 12/23/18  7:14 AM  Result Value Ref Range   WBC 8.9 3.4 - 10.8 x10E3/uL   RBC 4.93 3.77 - 5.28 x10E6/uL   Hemoglobin 14.1 11.1 - 15.9 g/dL   Hematocrit 24.4 01.0 - 46.6 %   MCV 86 79 - 97 fL   MCH 28.6 26.6 - 33.0 pg   MCHC 33.3 31.5 - 35.7 g/dL   RDW 27.2 53.6 - 64.4 %   Platelets 307 150 - 450 x10E3/uL   Neutrophils 47 Not Estab. %   Lymphs 44 Not Estab. %   Monocytes 7 Not Estab. %   Eos 2 Not Estab. %   Basos 0 Not Estab. %   Neutrophils Absolute 4.1 1.4 - 7.0 x10E3/uL   Lymphocytes Absolute 3.9 (H) 0.7 - 3.1 x10E3/uL   Monocytes Absolute 0.7 0.1 - 0.9 x10E3/uL   EOS (ABSOLUTE) 0.2 0.0 - 0.4 x10E3/uL   Basophils Absolute 0.0 0.0 - 0.2 x10E3/uL   Immature Granulocytes 0 Not Estab. %   Immature Grans (Abs) 0.0 0.0 - 0.1 x10E3/uL  Lipid panel     Status: Abnormal   Collection Time: 12/23/18  7:14 AM  Result Value Ref Range   Cholesterol, Total 296 (H) 100 - 199 mg/dL   Triglycerides 034 0 - 149 mg/dL   HDL 43 >74 mg/dL   VLDL Cholesterol Cal 24 5 - 40 mg/dL   LDL Calculated 259 (H) 0 - 99 mg/dL    Comment: **Effective December 26, 2018, [facility] is implementing an improved** equation to calculate Low Density Lipoprotein Cholesterol (LDL-C) concentrations, to be used in all lipid panels that report calculated LDL-C. This equation was developed through a collaboration with the [employer], Lung and Blood Institutes of Health (NIH).[1] The NIH calculation overcomes the limitations of  the existing Friedewald LDL-C equation and performs equally well in both fasting and non-fasting individuals. 1. Rebbeca Paul Q, et al. A new equation for calculation of low-density  lipoprotein cholesterol in patients with normolipidemia and/or hypertriglyceridemia. JAMA Cardiol. 2020 Feb 26. doi:10.1001/jamacardio.2020.0013    Chol/HDL Ratio 6.9 (H) 0.0 - 4.4 ratio    Comment:                                   T. Chol/HDL Ratio                                             Men  Women                               1/2 Avg.Risk  3.4    3.3                                   Avg.Risk  5.0    4.4                                2X Avg.Risk  9.6    7.1                                3X Avg.Risk 23.4   11.0   TSH     Status: None   Collection Time: 12/23/18  7:14 AM  Result Value Ref Range   TSH 0.694 0.450 - 4.500 uIU/mL  Urinalysis, Routine w reflex microscopic     Status: None   Collection Time: 12/23/18  7:14 AM  Result Value Ref Range   Specific Gravity, UA 1.021 1.005 - 1.030   pH, UA 6.0 5.0 - 7.5   Color, UA Yellow Yellow   Appearance Ur Clear Clear   Leukocytes,UA Negative Negative   Protein,UA Negative Negative/Trace   Glucose, UA Negative Negative   Ketones, UA Negative Negative   RBC, UA Negative Negative   Bilirubin, UA Negative Negative   Urobilinogen, Ur 0.2 0.2 - 1.0 mg/dL   Nitrite, UA Negative Negative   Microscopic Examination Comment     Comment: Microscopic not indicated and not performed.  Hemoglobin A1c     Status: None   Collection Time: 12/23/18  7:14 AM  Result Value Ref Range   Hgb A1c MFr Bld 5.5 4.8 - 5.6 %    Comment:          Prediabetes: 5.7 - 6.4          Diabetes: >6.4          Glycemic control for adults with diabetes: <7.0    Est. average glucose Bld gHb Est-mCnc 111 mg/dL  Vitamin D (25 hydroxy)     Status: Abnormal   Collection Time: 12/23/18  7:14 AM  Result Value Ref Range   Vit D, 25-Hydroxy 13.1 (L) 30.0 - 100.0 ng/mL    Comment: Vitamin D deficiency has been defined by the Institute of Medicine and an Endocrine Society practice guideline as a level of serum 25-OH vitamin  D less than 20 ng/mL (1,2). The Endocrine  Society went on to further define vitamin D insufficiency as a level between 21 and 29 ng/mL (2). 1. IOM (Institute of Medicine). 2010. Dietary reference    intakes for calcium and D. [place]: The    [employer]. 2. Holick MF, Binkley [place], Bischoff-Ferrari HA, et al.    Evaluation, treatment, and prevention of vitamin D    deficiency: an Endocrine Society clinical practice    guideline. JCEM. 2011 Jul; 96(7):1911-30.    Objective  Body mass index is 42.23 kg/m. Wt Readings from Last 3 Encounters:  02/14/19 246 lb (111.6 kg)  01/03/18 232 lb (105.2 kg)  11/01/17 237 lb (107.5 kg)   Temp Readings from Last 3 Encounters:  02/14/19 98.2 F (36.8 C) (Oral)   BP Readings from Last 3 Encounters:  02/14/19 136/76  01/03/18 130/80  11/01/17 (!) 148/80   Pulse Readings from Last 3 Encounters:  02/14/19 84    Physical Exam Vitals signs and nursing note reviewed.  Constitutional:      Appearance: Normal appearance. She is well-developed and well-groomed. She is obese.     Comments: +mask on    HENT:     Head: Normocephalic and atraumatic.     Comments: B/l cerumen impaction   Eyes:     Conjunctiva/sclera: Conjunctivae normal.     Pupils: Pupils are equal, round, and reactive to light.  Cardiovascular:     Rate and Rhythm: Normal rate and regular rhythm.     Heart sounds: Normal heart sounds.  Pulmonary:     Effort: Pulmonary effort is normal.     Breath sounds: Normal breath sounds.  Skin:    General: Skin is warm and dry.       Neurological:     General: No focal deficit present.     Mental Status: She is alert and oriented to person, place, and time. Mental status is at baseline.     Gait: Gait normal.  Psychiatric:        Attention and Perception: Attention and perception normal.        Mood and Affect: Mood and affect normal.        Speech: Speech normal.        Behavior: Behavior normal. Behavior is cooperative.        Thought Content:  Thought content normal.        Cognition and Memory: Cognition and memory normal.        Judgment: Judgment normal.     Assessment  Plan  Annual physical exam Flu shot today  Tdap utd 01/09/19  Consider shingrix in future    mammo 01/23/2019 negative  Pap 10/04/17 negative Dr. Kenton Kingfisher ob/gyn  Colonoscopy [facility] pt wants to wait until 04/2019  Skin left back skin tag removed sent to [facility]  rec healthy diet and exercise   Essential hypertension/HLD- Plan: Ambulatory referral to Pulmonology r/o OSA, Comprehensive metabolic panel, Lipid panel norvasc 5 mg qd  lipitor 10 mg qhs check cmet, lipid  C/w OSA (obstructive sleep apnea) with snoring and HTN - Plan: Ambulatory referral to Pulmonology for home sleep study   Skin tag - Plan: Pathology ([facility]) left lower back removed  Pt consented and tolerated procedure no local anesthesia used  15 blade and tweezers.  Vitamin D deficiency - Plan: Vitamin D (25 hydroxy)  Bilateral impacted cerumen  Lavage b/l ears pt tolerated and all wax removed   03/09/19 [facility]  seen 03/09/19 low back pain rec t x 6 visits Xray lumbar misalighnments of L spine and reduced ht and IVF L5-S1 lumbar disc bulge   Provider: Dr. French Anaracy McLean-Scocuzza-[facility]

## 2019-02-24 LAB — ANATOMIC PATHOLOGY REPORT: PDF Image: 0

## 2019-02-28 ENCOUNTER — Encounter: Payer: Self-pay | Admitting: Internal Medicine

## 2019-02-28 ENCOUNTER — Other Ambulatory Visit: Payer: Self-pay

## 2019-02-28 ENCOUNTER — Ambulatory Visit (INDEPENDENT_AMBULATORY_CARE_PROVIDER_SITE_OTHER): Payer: 59 | Admitting: Internal Medicine

## 2019-02-28 VITALS — BP 126/78 | HR 83 | Temp 98.4°F | Ht 64.0 in | Wt 243.2 lb

## 2019-02-28 DIAGNOSIS — G4719 Other hypersomnia: Secondary | ICD-10-CM

## 2019-02-28 NOTE — Patient Instructions (Addendum)
Obtain sleep study for definitive diagnosis for sleep apnea  +Murmur-discuss with PCP about cardiology referral

## 2019-02-28 NOTE — Progress Notes (Signed)
Name: Donna Hinton MRN: [id number] DOB: 08/16/1970     CONSULTATION DATE: 02/28/2019  REFERRING MD : Lucien Mons  CHIEF COMPLAINT: Excessive daytime sleepiness    HISTORY OF PRESENT ILLNESS:   Patient is seen today for problems and issues with sleep related to excessive daytime sleepiness Patient  has been having sleep problems for many years Patient has been having excessive daytime sleepiness for a long time Patient has been having extreme fatigue and tiredness, lack of energy +  very Loud snoring every night + struggling breathe at night and gasps for air  +HTN several months ago  Discussed sleep data and reviewed with patient.  Encouraged proper weight management.  Discussed driving precautions and its relationship with hypersomnolence.  Discussed operating dangerous equipment and its relationship with hypersomnolence.  Discussed sleep hygiene, and benefits of a fixed sleep waked time.  The importance of getting eight or more hours of sleep discussed with patient.  Discussed limiting the use of the computer and television before bedtime.  Decrease naps during the day, so night time sleep will become enhanced.  Limit caffeine, and sleep deprivation.  HTN, stroke, and heart failure are potential risk factors.    EPWORTH SLEEP SCORE 16    PAST MEDICAL HISTORY :   has a past medical history of Gestational diabetes and HTN (hypertension).  has a past surgical history that includes lsh and Abdominal hysterectomy. Prior to Admission medications   Medication Sig Start Date End Date Taking? Authorizing Provider  amLODipine (NORVASC) 5 MG tablet Take 1 tablet (5 mg total) by mouth daily. 01/12/19  Yes McLean-Scocuzza, Pasty Spillers, MD  atorvastatin (LIPITOR) 10 MG tablet Take 1 tablet (10 mg total) by mouth daily at 6 PM. 12/27/18  Yes McLean-Scocuzza, Pasty Spillers, MD  BIOTIN PO Take by mouth.   Yes [provider]  Cholecalciferol 1.25 MG (50000 UT) capsule Take 1 capsule  (50,000 Units total) by mouth once a week. 12/26/18  Yes McLean-Scocuzza, Pasty Spillers, MD   Allergies  Allergen Reactions  . Sulfa Antibiotics     Hives     FAMILY HISTORY:  family history includes Breast cancer (age of onset: 36) in her mother; Dementia in her mother; Diabetes in her father and another family member; Hypertension in her father and sister. SOCIAL HISTORY:  reports that she has never smoked. She has never used smokeless tobacco. She reports that she does not drink alcohol or use drugs.  REVIEW OF SYSTEMS:   Constitutional: Negative for fever, chills, weight loss, malaise/fatigue and diaphoresis.  HENT: Negative for hearing loss, ear pain, nosebleeds, congestion, sore throat, neck pain, tinnitus and ear discharge.   Eyes: Negative for blurred vision, double vision, photophobia, pain, discharge and redness.  Respiratory: Negative for cough, hemoptysis, sputum production, shortness of breath, wheezing and stridor.   Cardiovascular: Negative for chest pain, palpitations, orthopnea, claudication, leg swelling and PND.  Gastrointestinal: Negative for heartburn, nausea, vomiting, abdominal pain, diarrhea, constipation, blood in stool and melena.  Genitourinary: Negative for dysuria, urgency, frequency, hematuria and flank pain.  Musculoskeletal: Negative for myalgias, back pain, joint pain and falls.  Skin: Negative for itching and rash.  Neurological: Negative for dizziness, tingling, tremors, sensory change, speech change, focal weakness, seizures, loss of consciousness, weakness and headaches.  Endo/Heme/Allergies: Negative for environmental allergies and polydipsia. Does not bruise/bleed easily.  ALL OTHER ROS ARE NEGATIVE   BP 126/78 (BP Location: Left Arm, Cuff Size: Normal)   Pulse 83   Temp 98.4 F (36.9  C) (Temporal)   Ht 5\' 4"  (1.626 m)   Wt 243 lb 3.2 oz (110.3 kg)   SpO2 96%   BMI 41.75 kg/m    Physical Examination:   GENERAL:NAD, no fevers, chills, no  weakness no fatigue HEAD: Normocephalic, atraumatic.  EYES: Pupils equal, round, reactive to light. Extraocular muscles intact. No scleral icterus.  MOUTH: Moist mucosal membrane.   EAR, NOSE, THROAT: Clear without exudates. No external lesions.  NECK: Supple. No thyromegaly. No nodules. No JVD.  PULMONARY:CTA B/L no wheezes, no crackles, no rhonchi CARDIOVASCULAR: S1 and S2. Regular rate and rhythm. + systolic murmurs GASTROINTESTINAL: Soft, nontender, nondistended. No masses. Positive bowel sounds.  MUSCULOSKELETAL: No swelling, clubbing, or edema. Range of motion full in all extremities.  NEUROLOGIC: Cranial nerves II through XII are intact. No gross focal neurological deficits.  SKIN: No ulceration, lesions, rashes, or cyanosis. Skin warm and dry. Turgor intact.  PSYCHIATRIC: Mood, affect within normal limits. The patient is awake, alert and oriented x 3. Insight, judgment intact.     ASSESSMENT AND PLAN SYNOPSIS Signs symptoms of snoring and excessive daytime sleepiness consistent with probable underlying sleep apnea  Patient will need sleep study for definitive diagnosis  Obesity -recommend significant weight loss -recommend changing diet  Deconditioned state -Recommend increased daily activity and exercise  +Systolic murmur on exam PCP to assess referral to cardiology if needed No symptoms at this time  COVID-19 EDUCATION: The signs and symptoms of COVID-19 were discussed with the patient and how to seek care for testing.  The importance of social distancing was discussed today. Hand Washing Techniques and avoid touching face was advised.     MEDICATION ADJUSTMENTS/LABS AND TESTS ORDERED: Obtain sleep study   CURRENT MEDICATIONS REVIEWED AT LENGTH WITH PATIENT TODAY   Patient satisfied with Plan of action and management. All questions answered  Follow up in 3 months   Markon Jares Patricia Pesa, M.D.  [facility]  Medical Director  [place] Director [facility] Cardio-Pulmonary Department

## 2019-03-05 ENCOUNTER — Encounter: Payer: Self-pay | Admitting: Internal Medicine

## 2019-03-13 ENCOUNTER — Telehealth: Payer: Self-pay | Admitting: Internal Medicine

## 2019-03-13 NOTE — Telephone Encounter (Signed)
Called and got no answer will try to call laSally E Ottinger I will try to call her later

## 2019-03-14 NOTE — Telephone Encounter (Signed)
I have this order an tried to contact the patient yesterday and lmam

## 2019-03-14 NOTE — Telephone Encounter (Signed)
appt scheduled for 03/20/19

## 2019-03-20 ENCOUNTER — Other Ambulatory Visit: Payer: Self-pay

## 2019-03-20 ENCOUNTER — Ambulatory Visit: Payer: 59

## 2019-03-20 DIAGNOSIS — G4733 Obstructive sleep apnea (adult) (pediatric): Secondary | ICD-10-CM | POA: Diagnosis not present

## 2019-03-20 DIAGNOSIS — G4719 Other hypersomnia: Secondary | ICD-10-CM

## 2019-03-24 ENCOUNTER — Telehealth: Payer: Self-pay | Admitting: Pulmonary Disease

## 2019-03-24 DIAGNOSIS — G4733 Obstructive sleep apnea (adult) (pediatric): Secondary | ICD-10-CM

## 2019-03-24 NOTE — Telephone Encounter (Signed)
HST 03/20/19 >> AHI 19.9, SpO2 low 80%.   Please inform her that her sleep study shows moderate obstructive sleep apnea.  Please arrange for ROV with Dr. Mortimer Fries or NP to discuss treatment options.

## 2019-03-24 NOTE — Telephone Encounter (Signed)
Left message for pt

## 2019-03-27 NOTE — Telephone Encounter (Signed)
Pt is aware of results and voiced her understanding.  Pt has been scheduled for virtual visit 03/31/2019 with DK. Nothing further is needed.

## 2019-03-30 ENCOUNTER — Encounter: Payer: Self-pay | Admitting: Internal Medicine

## 2019-03-31 ENCOUNTER — Encounter: Payer: Self-pay | Admitting: Internal Medicine

## 2019-03-31 ENCOUNTER — Ambulatory Visit (INDEPENDENT_AMBULATORY_CARE_PROVIDER_SITE_OTHER): Payer: 59 | Admitting: Internal Medicine

## 2019-03-31 DIAGNOSIS — G4733 Obstructive sleep apnea (adult) (pediatric): Secondary | ICD-10-CM

## 2019-03-31 NOTE — Patient Instructions (Signed)
MEDICATION ADJUSTMENTS/LABS AND TESTS ORDERED: Start AUTO CPAP 5-15 cm 20

## 2019-03-31 NOTE — Progress Notes (Signed)
Name: Donna Hinton MRN: [id number] DOB: 01/07/1971     I connected with the patient by telephone enabled telemedicine visit and verified that I am speaking with the correct person using two identifiers.    I discussed the limitations, risks, security and privacy concerns of performing an evaluation and management service by telemedicine and the availability of in-person appointments. I also discussed with the patient that there may be a patient responsible charge related to this service. The patient expressed understanding and agreed to proceed.  PATIENT AGREES AND CONFIRMS -YES   Other persons participating in the visit and their role in the encounter: Patient, nursing  This visit type was conducted due to national recommendations for restrictions regarding the COVID-19 Pandemic (e.g. social distancing).  This format is felt to be most appropriate for this patient at this time.  All issues noted in this document were discussed and addressed.        CONSULTATION DATE: 03/31/2019  REFERRING MD : Trey Paula  CHIEF COMPLAINT: follow up OSA   HISTORY OF PRESENT ILLNESS: HST 03/20/19 >> AHI 19.9, SpO2 low 80%. findings discussed with patient  No evidence of heart failure at this time No evidence or signs of infection at this time No respiratory distress No fevers, chills, nausea, vomiting, diarrhea No evidence of lower extremity edema No evidence hemoptysis   PAST MEDICAL HISTORY :   has a past medical history of Gestational diabetes and HTN (hypertension).  has a past surgical history that includes lsh and Abdominal hysterectomy. Prior to Admission medications   Medication Sig Start Date End Date Taking? Authorizing Provider  amLODipine (NORVASC) 5 MG tablet Take 1 tablet (5 mg total) by mouth daily. 01/12/19  Yes McLean-Scocuzza, Nino Glow, MD  atorvastatin (LIPITOR) 10 MG tablet Take 1 tablet (10 mg total) by mouth daily at 6 PM. 12/27/18  Yes McLean-Scocuzza, Nino Glow, MD  BIOTIN  PO Take by mouth.   Yes [provider]  Cholecalciferol 1.25 MG (50000 UT) capsule Take 1 capsule (50,000 Units total) by mouth once a week. 12/26/18  Yes McLean-Scocuzza, Nino Glow, MD   Allergies  Allergen Reactions  . Sulfa Antibiotics     Hives     FAMILY HISTORY:  family history includes Breast cancer (age of onset: 19) in her mother; Dementia in her mother; Diabetes in her father and another family member; Hypertension in her father and sister. SOCIAL HISTORY:  reports that she has never smoked. She has never used smokeless tobacco. She reports that she does not drink alcohol or use drugs.   Review of Systems:  Gen:  Denies  fever, sweats, chills weight loss  HEENT: Denies blurred vision, double vision, ear pain, eye pain, hearing loss, nose bleeds, sore throat Cardiac:  No dizziness, chest pain or heaviness, chest tightness,edema, No JVD Resp:   No cough, -sputum production, -shortness of breath,-wheezing, -hemoptysis,  Gi: Denies swallowing difficulty, stomach pain, nausea or vomiting, diarrhea, constipation, bowel incontinence Gu:  Denies bladder incontinence, burning urine Ext:   Denies Joint pain, stiffness or swelling Skin: Denies  skin rash, easy bruising or bleeding or hives Endoc:  Denies polyuria, polydipsia , polyphagia or weight change Psych:   Denies depression, insomnia or hallucinations  Other:  All other systems negative   ASSESSMENT AND PLAN SYNOPSIS Moderate OSA new DX Findings discussed with patient Start AUTOCPAP 5-15 cm h20  Obesity -recommend significant weight loss -recommend changing diet  Deconditioned state -Recommend increased daily activity and exercise  COVID-19 EDUCATION: The signs and symptoms of COVID-19 were discussed with the patient and how to seek care for testing.  The importance of social distancing was discussed today. Hand Washing Techniques and avoid touching face was advised.     MEDICATION ADJUSTMENTS/LABS  AND TESTS ORDERED: Start AUTO CPAP 5-15 cm 20   CURRENT MEDICATIONS REVIEWED AT LENGTH WITH PATIENT TODAY   Patient satisfied with Plan of action and management. All questions answered  Follow up in 3 months Total Time Spent 21 mins  Wallis Bamberg Santiago Glad, M.D.  [facility]  Medical Director [facility] Medical Director [facility] Cardio-Pulmonary Department

## 2019-03-31 NOTE — Addendum Note (Signed)
Addended by: Maryanna Shape A on: 03/31/2019 11:03 AM   Modules accepted: Orders

## 2019-05-17 ENCOUNTER — Encounter: Payer: Self-pay | Admitting: Internal Medicine

## 2019-05-17 ENCOUNTER — Ambulatory Visit (INDEPENDENT_AMBULATORY_CARE_PROVIDER_SITE_OTHER): Payer: 59 | Admitting: Internal Medicine

## 2019-05-17 DIAGNOSIS — G4733 Obstructive sleep apnea (adult) (pediatric): Secondary | ICD-10-CM | POA: Diagnosis not present

## 2019-05-17 DIAGNOSIS — Z9989 Dependence on other enabling machines and devices: Secondary | ICD-10-CM | POA: Diagnosis not present

## 2019-05-17 NOTE — Patient Instructions (Signed)
Continue AUTOCPAP as prescribed  

## 2019-05-17 NOTE — Progress Notes (Signed)
Name: Donna Hinton MRN: [id number] DOB: 11/22/1970      I connected with the patient by telephone enabled telemedicine visit and verified that I am speaking with the correct person using two identifiers.    I discussed the limitations, risks, security and privacy concerns of performing an evaluation and management service by telemedicine and the availability of in-person appointments. I also discussed with the patient that there may be a patient responsible charge related to this service. The patient expressed understanding and agreed to proceed.  PATIENT AGREES AND CONFIRMS -YES   Other persons participating in the visit and their role in the encounter: Patient, nursing  This visit type was conducted due to national recommendations for restrictions regarding the COVID-19 Pandemic (e.g. social distancing).  This format is felt to be most appropriate for this patient at this time.  All issues noted in this document were discussed and addressed.          CONSULTATION DATE: 05/17/2019  REFERRING MD : [place] up OSA   HISTORY OF PRESENT ILLNESS: HST 03/20/19 >> AHI 19.9, SpO2 low 80%. findings discussed with patient  COMPLIANCE REPORT REVIEWED WITH PATIENT IN DETAIL   No evidence of heart failure at this time No evidence or signs of infection at this time No respiratory distress No fevers, chills, nausea, vomiting, diarrhea No evidence of lower extremity edema No evidence hemoptysis  AUTO CPAP 5-15 cm h20 AHI down to 0.6 Full face mask      PAST MEDICAL HISTORY :   has a past medical history of Gestational diabetes and HTN (hypertension).  has a past surgical history that includes lsh and Abdominal hysterectomy. Prior to Admission medications   Medication Sig Start Date End Date Taking? Authorizing Provider  amLODipine (NORVASC) 5 MG tablet Take 1 tablet (5 mg total) by mouth daily. 01/12/19  Yes McLean-Scocuzza, Nino Glow, MD  atorvastatin  (LIPITOR) 10 MG tablet Take 1 tablet (10 mg total) by mouth daily at 6 PM. 12/27/18  Yes McLean-Scocuzza, Nino Glow, MD  BIOTIN PO Take by mouth.   Yes [provider]  Cholecalciferol 1.25 MG (50000 UT) capsule Take 1 capsule (50,000 Units total) by mouth once a week. 12/26/18  Yes McLean-Scocuzza, Nino Glow, MD   Allergies  Allergen Reactions  . Sulfa Antibiotics     Hives      Review of Systems:  Gen:  Denies  fever, sweats, chills weight loss  HEENT: Denies blurred vision, double vision, ear pain, eye pain, hearing loss, nose bleeds, sore throat Cardiac:  No dizziness, chest pain or heaviness, chest tightness,edema, No JVD Resp:   No cough, -sputum production, -shortness of breath,-wheezing, -hemoptysis,  Gi: Denies swallowing difficulty, stomach pain, nausea or vomiting, diarrhea, constipation, bowel incontinence Gu:  Denies bladder incontinence, burning urine Ext:   Denies Joint pain, stiffness or swelling Skin: Denies  skin rash, easy bruising or bleeding or hives Endoc:  Denies polyuria, polydipsia , polyphagia or weight change Psych:   Denies depression, insomnia or hallucinations  Other:  All other systems negative      ASSESSMENT AND PLAN SYNOPSIS  Moderate OSA AHI significantly improved down to AHI 0.6 from 19 She uses and benefits from CPAP She has excellent compliance >87% hrs and days  Obesity -recommend significant weight loss -recommend changing diet  Deconditioned state -Recommend increased daily activity and exercise   COVID-19 EDUCATION: The signs and symptoms of COVID-19 were discussed with the patient and how to seek  care for testing.  The importance of social distancing was discussed today. Hand Washing Techniques and avoid touching face was advised.     MEDICATION ADJUSTMENTS/LABS AND TESTS ORDERED: Continue AUTOCPAP as prescribed   CURRENT MEDICATIONS REVIEWED AT LENGTH WITH PATIENT TODAY   Patient satisfied with Plan of action  and management. All questions answered  Follow up in 6 months Total time 21 mins  Enmanuel Zufall Santiago Glad, M.D.  [facility]  Medical Director [facility] Medical Director [facility] Cardio-Pulmonary Department

## 2019-06-20 ENCOUNTER — Telehealth: Payer: Self-pay | Admitting: Internal Medicine

## 2019-06-20 NOTE — Telephone Encounter (Signed)
Pt needs fating labs [facility] ordered 01/2019  Call to pick up lab order and go to [facility] fasting labs  Will not fill vitamin D until check vitamin D via labs   TMs

## 2019-06-21 NOTE — Telephone Encounter (Signed)
Left message to return call 

## 2019-06-22 ENCOUNTER — Encounter: Payer: Self-pay | Admitting: Internal Medicine

## 2019-06-22 ENCOUNTER — Telehealth: Payer: Self-pay | Admitting: Internal Medicine

## 2019-06-22 NOTE — Telephone Encounter (Signed)
[place]      Telephone Encounter  Signed  Creation Time:  06/22/2019 4:15 PM          Signed        Pt would like a referral for a colonoscopy

## 2019-06-22 NOTE — Telephone Encounter (Signed)
Message routed to Dr French Ana via telephone encounter.

## 2019-06-22 NOTE — Telephone Encounter (Addendum)
Message added to previous encounter from 06/20/19

## 2019-06-22 NOTE — Telephone Encounter (Signed)
Pt would like a referral for a colonoscopy.

## 2019-06-22 NOTE — Telephone Encounter (Signed)
Left message to return call.  Labs placed up front.

## 2019-06-25 NOTE — Addendum Note (Signed)
Addended by: Quentin Ore on: 06/25/2019 10:01 PM   Modules accepted: Orders

## 2019-06-25 NOTE — Telephone Encounter (Signed)
Gi referral placed  Labs for [facility] fasting on printer for pt to come pick up and other labs for other pts  Thanks TMS

## 2019-06-26 NOTE — Telephone Encounter (Signed)
Left message to return call 

## 2019-06-27 NOTE — Telephone Encounter (Signed)
Patient returning my call and informed of the information below. Patient verbalized understanding

## 2019-07-01 LAB — COMPREHENSIVE METABOLIC PANEL
ALT: 10 IU/L (ref 0–32)
AST: 12 IU/L (ref 0–40)
Albumin/Globulin Ratio: 1.8 (ref 1.2–2.2)
Albumin: 4.3 g/dL (ref 3.8–4.8)
Alkaline Phosphatase: 81 IU/L (ref 39–117)
BUN/Creatinine Ratio: 13 (ref 9–23)
BUN: 10 mg/dL (ref 6–24)
Bilirubin Total: 0.3 mg/dL (ref 0.0–1.2)
CO2: 22 mmol/L (ref 20–29)
Calcium: 9.5 mg/dL (ref 8.7–10.2)
Chloride: 103 mmol/L (ref 96–106)
Creatinine, Ser: 0.79 mg/dL (ref 0.57–1.00)
GFR calc Af Amer: 101 mL/min/{1.73_m2} (ref >59)
GFR calc non Af Amer: 88 mL/min/{1.73_m2} (ref >59)
Globulin, Total: 2.4 g/dL (ref 1.5–4.5)
Glucose: 103 mg/dL — ABNORMAL HIGH (ref 65–99)
Potassium: 4.3 mmol/L (ref 3.5–5.2)
Sodium: 141 mmol/L (ref 134–144)
Total Protein: 6.7 g/dL (ref 6.0–8.5)

## 2019-07-01 LAB — LIPID PANEL
Chol/HDL Ratio: 4.7 ratio — ABNORMAL HIGH (ref 0.0–4.4)
Cholesterol, Total: 216 mg/dL — ABNORMAL HIGH (ref 100–199)
HDL: 46 mg/dL (ref >39)
LDL Chol Calc (NIH): 153 mg/dL — ABNORMAL HIGH (ref 0–99)
Triglycerides: 95 mg/dL (ref 0–149)
VLDL Cholesterol Cal: 17 mg/dL (ref 5–40)

## 2019-07-01 LAB — VITAMIN D 25 HYDROXY (VIT D DEFICIENCY, FRACTURES): Vit D, 25-Hydroxy: 57.6 ng/mL (ref 30.0–100.0)

## 2019-07-06 ENCOUNTER — Other Ambulatory Visit: Payer: Self-pay | Admitting: Internal Medicine

## 2019-07-06 ENCOUNTER — Encounter: Payer: Self-pay | Admitting: Internal Medicine

## 2019-07-06 DIAGNOSIS — E785 Hyperlipidemia, unspecified: Secondary | ICD-10-CM

## 2019-07-06 MED ORDER — ATORVASTATIN CALCIUM 20 MG PO TABS: 20.0000 mg | ORAL_TABLET | Freq: Every day | ORAL | 3 refills | Status: DC

## 2019-07-06 NOTE — Telephone Encounter (Signed)
Pt was returning your call.

## 2019-07-06 NOTE — Telephone Encounter (Signed)
Left message to return call 

## 2019-07-06 NOTE — Telephone Encounter (Signed)
Patient sent in [place] message that has been routed to DR French Ana. Updated patient's contact information and added preferred work number.

## 2019-07-06 NOTE — Telephone Encounter (Signed)
McLean-Scocuzza, Donna Spillers, MD  07/02/2019 9:39 PM EST    Liver kidneys normal   Cholesterol improved  -is she agreeable to increase lipitor to 20 mg at night?   Vitamin D improved  -rec D3 otc 4000 Iu daily

## 2019-07-10 ENCOUNTER — Other Ambulatory Visit: Payer: Self-pay

## 2019-07-10 ENCOUNTER — Telehealth: Payer: Self-pay

## 2019-07-10 DIAGNOSIS — Z1211 Encounter for screening for malignant neoplasm of colon: Secondary | ICD-10-CM

## 2019-07-10 NOTE — Telephone Encounter (Signed)
Gastroenterology Pre-Procedure Review  Request Date: Friday 08/25/19 Requesting Physician: Dr. Tobi Bastos  PATIENT REVIEW QUESTIONS: The patient responded to the following health history questions as indicated:    1. Are you having any GI issues? no 2. Do you have a personal history of Polyps? no 3. Do you have a family history of Colon Cancer or Polyps? no 4. Diabetes Mellitus? no 5. Joint replacements in the past 12 months?no 6. Major health problems in the past 3 months?no 7. Any artificial heart valves, MVP, or defibrillator?no    MEDICATIONS & ALLERGIES:    Patient reports the following regarding taking any anticoagulation/antiplatelet therapy:   Plavix, Coumadin, Eliquis, Xarelto, Lovenox, Pradaxa, Brilinta, or Effient? no Aspirin? no  Patient confirms/reports the following medications:  Current Outpatient Medications  Medication Sig Dispense Refill  . amLODipine (NORVASC) 5 MG tablet Take 1 tablet (5 mg total) by mouth daily. 90 tablet 3  . atorvastatin (LIPITOR) 20 MG tablet Take 1 tablet (20 mg total) by mouth daily at 6 PM. 90 tablet 3  . BIOTIN PO Take by mouth.    . Cholecalciferol 1.25 MG (50000 UT) capsule Take 1 capsule (50,000 Units total) by mouth once a week. 13 capsule 1   No current facility-administered medications for this visit.    Patient confirms/reports the following allergies:  Allergies  Allergen Reactions  . Sulfa Antibiotics     Hives     No orders of the defined types were placed in this encounter.   AUTHORIZATION INFORMATION Primary Insurance: 1D#: Group #:  Secondary Insurance: 1D#: Group #:  SCHEDULE INFORMATION: Date: Friday 08/25/19 Time: Location:[facility]

## 2019-07-18 ENCOUNTER — Other Ambulatory Visit: Payer: Self-pay

## 2019-07-18 ENCOUNTER — Encounter: Payer: Self-pay | Admitting: Internal Medicine

## 2019-07-18 ENCOUNTER — Ambulatory Visit (INDEPENDENT_AMBULATORY_CARE_PROVIDER_SITE_OTHER): Payer: 59 | Admitting: Internal Medicine

## 2019-07-18 VITALS — BP 150/86 | HR 80 | Temp 97.7°F | Ht 64.0 in | Wt 248.2 lb

## 2019-07-18 DIAGNOSIS — E559 Vitamin D deficiency, unspecified: Secondary | ICD-10-CM | POA: Diagnosis not present

## 2019-07-18 DIAGNOSIS — R739 Hyperglycemia, unspecified: Secondary | ICD-10-CM | POA: Diagnosis not present

## 2019-07-18 DIAGNOSIS — G4733 Obstructive sleep apnea (adult) (pediatric): Secondary | ICD-10-CM | POA: Diagnosis not present

## 2019-07-18 DIAGNOSIS — Z1231 Encounter for screening mammogram for malignant neoplasm of breast: Secondary | ICD-10-CM

## 2019-07-18 DIAGNOSIS — I10 Essential (primary) hypertension: Secondary | ICD-10-CM | POA: Diagnosis not present

## 2019-07-18 DIAGNOSIS — Z9989 Dependence on other enabling machines and devices: Secondary | ICD-10-CM

## 2019-07-18 MED ORDER — TELMISARTAN 20 MG PO TABS: 20.0000 mg | ORAL_TABLET | Freq: Every day | ORAL | 3 refills | Status: DC

## 2019-07-18 NOTE — Patient Instructions (Addendum)
Vitamin D3 2000 to 4000 IU daily  Blood pressure goal <130/<80  Debrox ear wax 4-7 days both ears 1x per month Labs fasting after 10/29/19   Telmisartan Tablets What is this medicine? TELMISARTAN (tel mi SAR tan) is an angiotensin II receptor blocker, also known as an ARB. It treats high blood pressure. It may also be used to lower the risk of stroke or heart attack. This medicine may be used for other purposes; ask your health care provider or pharmacist if you have questions. COMMON BRAND NAME(S): Micardis What should I tell my health care provider before I take this medicine? They need to know if you have any of these conditions:  diet low in salt  kidney disease  liver disease  low levels of sodium in the blood  an unusual or allergic reaction to telmisartan, other medicines, foods, dyes, or preservatives  pregnant or trying to get pregnant  breast-feeding How should I use this medicine? Take this drug by mouth. Take it as directed on the prescription label at the same time every day. You can take it with or without food. If it upsets your stomach, take it with food. Keep taking it unless your health care provider tells you to stop. Talk to your health care provider about the use of this drug in children. Special care may be needed. Overdosage: If you think you have taken too much of this medicine contact a poison control center or emergency room at once. NOTE: This medicine is only for you. Do not share this medicine with others. What if I miss a dose? If you miss a dose, take it as soon as you can. If it is almost time for your next dose, take only that dose. Do not take double or extra doses. What may interact with this medicine?  certain medicines for blood pressure, heart disease, irregular heart beat  diuretics  lithium  NSAIDs, medicines for pain and inflammation, like ibuprofen or naproxen This list may not describe all possible interactions. Give your health care  provider a list of all the medicines, herbs, non-prescription drugs, or dietary supplements you use. Also tell them if you smoke, drink alcohol, or use illegal drugs. Some items may interact with your medicine. What should I watch for while using this medicine? Visit your health care provider for regular checks on your progress. Check your blood pressure as directed. Ask your health care provider what your blood pressure should be. Also find out when you should contact him or her. Women should inform their health care provider if they wish to become pregnant or think they might be pregnant. There is a potential for serious side effects and harm to an unborn child, particularly in the second or third trimester. Talk to your health care provider for more information. You may get dizzy. Do not drive, use machinery, or do anything that needs mental alertness until you know how this drug affects you. Do not stand or sit up quickly, especially if you are an older patient. This reduces the risk of dizzy or fainting spells. Avoid alcoholic drinks; they can make you more dizzy. Avoid salt substitutes unless you are told otherwise by your health care provider. Do not treat yourself for coughs, colds, or pain while you are taking this drug without asking your health care provider for advice. Some drugs may increase your blood pressure. What side effects may I notice from receiving this medicine? Side effects that you should report to your doctor or  health care professional as soon as possible:  allergic reactions (skin rash, itching or hives; swelling of the face, lips, or tongue)  high potassium levels (chest pain; fast, irregular heartbeat; muscle weakness)  kidney injury (trouble passing urine or change in the amount of urine)  low blood pressure (dizziness; feeling faint or lightheaded, falls; unusually weak or tired) Side effects that usually do not require medical attention (report to your doctor or  health care professional if they continue or are bothersome):  back pain  change in sex drive or performance  diarrhea  headache  stuffy nose This list may not describe all possible side effects. Call your doctor for medical advice about side effects. You may report side effects to FDA at 1-800-FDA-1088. Where should I keep my medicine? Keep out of the reach of children and pets. Store at room temperature between 15 and 30 degrees C (59 and 86 degrees F). Keep this drug in the original packaging until you are ready to take it. Throw away any unused drug after the expiration date. NOTE: This sheet is a summary. It may not cover all possible information. If you have questions about this medicine, talk to your doctor, pharmacist, or health care provider.  2020 Elsevier/Gold Standard (2019-01-23 14:00:59)  Hypertension, Adult High blood pressure (hypertension) is when the force of blood pumping through the arteries is too strong. The arteries are the blood vessels that carry blood from the heart throughout the body. Hypertension forces the heart to work harder to pump blood and may cause arteries to become narrow or stiff. Untreated or uncontrolled hypertension can cause a heart attack, heart failure, a stroke, kidney disease, and other problems. A blood pressure reading consists of a higher number over a lower number. Ideally, your blood pressure should be below 120/80. The first ("top") number is called the systolic pressure. It is a measure of the pressure in your arteries as your heart beats. The second ("bottom") number is called the diastolic pressure. It is a measure of the pressure in your arteries as the heart relaxes. What are the causes? The exact cause of this condition is not known. There are some conditions that result in or are related to high blood pressure. What increases the risk? Some risk factors for high blood pressure are under your control. The following factors may make  you more likely to develop this condition:  Smoking.  Having type 2 diabetes mellitus, high cholesterol, or both.  Not getting enough exercise or physical activity.  Being overweight.  Having too much fat, sugar, calories, or salt (sodium) in your diet.  Drinking too much alcohol. Some risk factors for high blood pressure may be difficult or impossible to change. Some of these factors include:  Having chronic kidney disease.  Having a family history of high blood pressure.  Age. Risk increases with age.  Race. You may be at higher risk if you are African American.  Gender. Men are at higher risk than women before age 22. After age 71, women are at higher risk than men.  Having obstructive sleep apnea.  Stress. What are the signs or symptoms? High blood pressure may not cause symptoms. Very high blood pressure (hypertensive crisis) may cause:  Headache.  Anxiety.  Shortness of breath.  Nosebleed.  Nausea and vomiting.  Vision changes.  Severe chest pain.  Seizures. How is this diagnosed? This condition is diagnosed by measuring your blood pressure while you are seated, with your arm resting on a flat  surface, your legs uncrossed, and your feet flat on the floor. The cuff of the blood pressure monitor will be placed directly against the skin of your upper arm at the level of your heart. It should be measured at least twice using the same arm. Certain conditions can cause a difference in blood pressure between your right and left arms. Certain factors can cause blood pressure readings to be lower or higher than normal for a short period of time:  When your blood pressure is higher when you are in a health care provider's office than when you are at home, this is called white coat hypertension. Most people with this condition do not need medicines.  When your blood pressure is higher at home than when you are in a health care provider's office, this is called masked  hypertension. Most people with this condition may need medicines to control blood pressure. If you have a high blood pressure reading during one visit or you have normal blood pressure with other risk factors, you may be asked to:  Return on a different day to have your blood pressure checked again.  Monitor your blood pressure at home for 1 week or longer. If you are diagnosed with hypertension, you may have other blood or imaging tests to help your health care provider understand your overall risk for other conditions. How is this treated? This condition is treated by making healthy lifestyle changes, such as eating healthy foods, exercising more, and reducing your alcohol intake. Your health care provider may prescribe medicine if lifestyle changes are not enough to get your blood pressure under control, and if:  Your systolic blood pressure is above 130.  Your diastolic blood pressure is above 80. Your personal target blood pressure may vary depending on your medical conditions, your age, and other factors. Follow these instructions at home: Eating and drinking   Eat a diet that is high in fiber and potassium, and low in sodium, added sugar, and fat. An example eating plan is called the DASH (Dietary Approaches to Stop Hypertension) diet. To eat this way: ? Eat plenty of fresh fruits and vegetables. Try to fill one half of your plate at each meal with fruits and vegetables. ? Eat whole grains, such as whole-wheat pasta, brown rice, or whole-grain bread. Fill about one fourth of your plate with whole grains. ? Eat or drink low-fat dairy products, such as skim milk or low-fat yogurt. ? Avoid fatty cuts of meat, processed or cured meats, and poultry with skin. Fill about one fourth of your plate with lean proteins, such as fish, chicken without skin, beans, eggs, or tofu. ? Avoid pre-made and processed foods. These tend to be higher in sodium, added sugar, and fat.  Reduce your daily sodium  intake. Most people with hypertension should eat less than 1,500 mg of sodium a day.  Do not drink alcohol if: ? Your health care provider tells you not to drink. ? You are pregnant, may be pregnant, or are planning to become pregnant.  If you drink alcohol: ? Limit how much you use to:  0-1 drink a day for women.  0-2 drinks a day for men. ? Be aware of how much alcohol is in your drink. In the [place] one drink equals one 12 oz bottle of beer (355 mL), one 5 oz glass of wine (148 mL), or one 1 oz glass of hard liquor (44 mL). Lifestyle   Work with your health care provider to maintain  a healthy body weight or to lose weight. Ask what an ideal weight is for you.  Get at least 30 minutes of exercise most days of the week. Activities may include walking, swimming, or biking.  Include exercise to strengthen your muscles (resistance exercise), such as Pilates or lifting weights, as part of your weekly exercise routine. Try to do these types of exercises for 30 minutes at least 3 days a week.  Do not use any products that contain nicotine or tobacco, such as cigarettes, e-cigarettes, and chewing tobacco. If you need help quitting, ask your health care provider.  Monitor your blood pressure at home as told by your health care provider.  Keep all follow-up visits as told by your health care provider. This is important. Medicines  Take over-the-counter and prescription medicines only as told by your health care provider. Follow directions carefully. Blood pressure medicines must be taken as prescribed.  Do not skip doses of blood pressure medicine. Doing this puts you at risk for problems and can make the medicine less effective.  Ask your health care provider about side effects or reactions to medicines that you should watch for. Contact a health care provider if you:  Think you are having a reaction to a medicine you are taking.  Have headaches that keep coming back  (recurring).  Feel dizzy.  Have swelling in your ankles.  Have trouble with your vision. Get help right away if you:  Develop a severe headache or confusion.  Have unusual weakness or numbness.  Feel faint.  Have severe pain in your chest or abdomen.  Vomit repeatedly.  Have trouble breathing. Summary  Hypertension is when the force of blood pumping through your arteries is too strong. If this condition is not controlled, it may put you at risk for serious complications.  Your personal target blood pressure may vary depending on your medical conditions, your age, and other factors. For most people, a normal blood pressure is less than 120/80.  Hypertension is treated with lifestyle changes, medicines, or a combination of both. Lifestyle changes include losing weight, eating a healthy, low-sodium diet, exercising more, and limiting alcohol. This information is not intended to replace advice given to you by your health care provider. Make sure you discuss any questions you have with your health care provider. Document Revised: 12/22/2017 Document Reviewed: 12/22/2017 Elsevier Patient Education  2020 [employer] Inc[employer]  DASH Eating Plan DASH stands for "Dietary Approaches to Stop Hypertension." The DASH eating plan is a healthy eating plan that has been shown to reduce high blood pressure (hypertension). It may also reduce your risk for type 2 diabetes, heart disease, and stroke. The DASH eating plan may also help with weight loss. What are tips for following this plan?  General guidelines  Avoid eating more than 2,300 mg (milligrams) of salt (sodium) a day. If you have hypertension, you may need to reduce your sodium intake to 1,500 mg a day.  Limit alcohol intake to no more than 1 drink a day for nonpregnant women and 2 drinks a day for men. One drink equals 12 oz of beer, 5 oz of wine, or 1 oz of hard liquor.  Work with your health care provider to maintain a healthy body weight  or to lose weight. Ask what an ideal weight is for you.  Get at least 30 minutes of exercise that causes your heart to beat faster (aerobic exercise) most days of the week. Activities may include walking, swimming, or biking.  Work with your health care provider or diet and nutrition specialist (dietitian) to adjust your eating plan to your individual calorie needs. Reading food labels   Check food labels for the amount of sodium per serving. Choose foods with less than 5 percent of the Daily Value of sodium. Generally, foods with less than 300 mg of sodium per serving fit into this eating plan.  To find whole grains, look for the word "whole" as the first word in the ingredient list. Shopping  Buy products labeled as "low-sodium" or "no salt added."  Buy fresh foods. Avoid canned foods and premade or frozen meals. Cooking  Avoid adding salt when cooking. Use salt-free seasonings or herbs instead of table salt or sea salt. Check with your health care provider or pharmacist before using salt substitutes.  Do not fry foods. Cook foods using healthy methods such as baking, boiling, grilling, and broiling instead.  Cook with heart-healthy oils, such as olive, canola, soybean, or sunflower oil. Meal planning  Eat a balanced diet that includes: ? 5 or more servings of fruits and vegetables each day. At each meal, try to fill half of your plate with fruits and vegetables. ? Up to 6-8 servings of whole grains each day. ? Less than 6 oz of lean meat, poultry, or fish each day. A 3-oz serving of meat is about the same size as a deck of cards. One egg equals 1 oz. ? 2 servings of low-fat dairy each day. ? A serving of nuts, seeds, or beans 5 times each week. ? Heart-healthy fats. Healthy fats called Omega-3 fatty acids are found in foods such as flaxseeds and coldwater fish, like sardines, salmon, and mackerel.  Limit how much you eat of the following: ? Canned or prepackaged foods. ? Food  that is high in trans fat, such as fried foods. ? Food that is high in saturated fat, such as fatty meat. ? Sweets, desserts, sugary drinks, and other foods with added sugar. ? Full-fat dairy products.  Do not salt foods before eating.  Try to eat at least 2 vegetarian meals each week.  Eat more home-cooked food and less restaurant, buffet, and fast food.  When eating at a restaurant, ask that your food be prepared with less salt or no salt, if possible. What foods are recommended? The items listed may not be a complete list. Talk with your dietitian about what dietary choices are best for you. Grains Whole-grain or whole-wheat bread. Whole-grain or whole-wheat pasta. Brown rice. Orpah Cobb. Bulgur. Whole-grain and low-sodium cereals. Pita bread. Low-fat, low-sodium crackers. Whole-wheat flour tortillas. Vegetables Fresh or frozen vegetables (raw, steamed, roasted, or grilled). Low-sodium or reduced-sodium tomato and vegetable juice. Low-sodium or reduced-sodium tomato sauce and tomato paste. Low-sodium or reduced-sodium canned vegetables. Fruits All fresh, dried, or frozen fruit. Canned fruit in natural juice (without added sugar). Meat and other protein foods Skinless chicken or [place]. Ground chicken or [place]. Pork with fat trimmed off. Fish and seafood. Egg whites. Dried beans, peas, or lentils. Unsalted nuts, nut butters, and seeds. Unsalted canned beans. Lean cuts of beef with fat trimmed off. Low-sodium, lean deli meat. Dairy Low-fat (1%) or fat-free (skim) milk. Fat-free, low-fat, or reduced-fat cheeses. Nonfat, low-sodium ricotta or cottage cheese. Low-fat or nonfat yogurt. Low-fat, low-sodium cheese. Fats and oils Soft margarine without trans fats. Vegetable oil. Low-fat, reduced-fat, or light mayonnaise and salad dressings (reduced-sodium). Canola, safflower, olive, soybean, and sunflower oils. Avocado. Seasoning and other foods Herbs. Spices. Seasoning mixes without  salt.  Unsalted popcorn and pretzels. Fat-free sweets. What foods are not recommended? The items listed may not be a complete list. Talk with your dietitian about what dietary choices are best for you. Grains Baked goods made with fat, such as croissants, muffins, or some breads. Dry pasta or rice meal packs. Vegetables Creamed or fried vegetables. Vegetables in a cheese sauce. Regular canned vegetables (not low-sodium or reduced-sodium). Regular canned tomato sauce and paste (not low-sodium or reduced-sodium). Regular tomato and vegetable juice (not low-sodium or reduced-sodium). Rosita Fire. Olives. Fruits Canned fruit in a light or heavy syrup. Fried fruit. Fruit in cream or butter sauce. Meat and other protein foods Fatty cuts of meat. Ribs. Fried meat. Tomasa Blase. Sausage. Bologna and other processed lunch meats. Salami. Fatback. Hotdogs. Bratwurst. Salted nuts and seeds. Canned beans with added salt. Canned or smoked fish. Whole eggs or egg yolks. Chicken or [place] with skin. Dairy Whole or 2% milk, cream, and half-and-half. Whole or full-fat cream cheese. Whole-fat or sweetened yogurt. Full-fat cheese. Nondairy creamers. Whipped toppings. Processed cheese and cheese spreads. Fats and oils Butter. Stick margarine. Lard. Shortening. Ghee. Bacon fat. Tropical oils, such as coconut, palm kernel, or palm oil. Seasoning and other foods Salted popcorn and pretzels. Onion salt, garlic salt, seasoned salt, table salt, and sea salt. Worcestershire sauce. Tartar sauce. Barbecue sauce. Teriyaki sauce. Soy sauce, including reduced-sodium. Steak sauce. Canned and packaged gravies. Fish sauce. Oyster sauce. Cocktail sauce. Horseradish that you find on the shelf. Ketchup. Mustard. Meat flavorings and tenderizers. Bouillon cubes. Hot sauce and Tabasco sauce. Premade or packaged marinades. Premade or packaged taco seasonings. Relishes. Regular salad dressings. Where to find more information:  National Heart, Lung, and Blood  Institute: PopSteam.is  American Heart Association: www.heart.org Summary  The DASH eating plan is a healthy eating plan that has been shown to reduce high blood pressure (hypertension). It may also reduce your risk for type 2 diabetes, heart disease, and stroke.  With the DASH eating plan, you should limit salt (sodium) intake to 2,300 mg a day. If you have hypertension, you may need to reduce your sodium intake to 1,500 mg a day.  When on the DASH eating plan, aim to eat more fresh fruits and vegetables, whole grains, lean proteins, low-fat dairy, and heart-healthy fats.  Work with your health care provider or diet and nutrition specialist (dietitian) to adjust your eating plan to your individual calorie needs. This information is not intended to replace advice given to you by your health care provider. Make sure you discuss any questions you have with your health care provider. Document Revised: 03/26/2017 Document Reviewed: 04/06/2016 Elsevier Patient Education  2020 [employer].

## 2019-07-18 NOTE — Progress Notes (Signed)
Chief Complaint  Patient presents with  . Follow-up   5 month f/u  1. HTN on norvasc 5 mg qd  2. HLD on lipitor 20 mg qhs    Review of Systems  Constitutional: Negative for weight loss.  HENT: Negative for hearing loss.   Eyes: Negative for blurred vision.  Respiratory: Negative for shortness of breath.   Cardiovascular: Negative for chest pain.  Gastrointestinal: Negative for abdominal pain.  Skin: Negative for rash.  Psychiatric/Behavioral: Negative for depression.   Past Medical History:  Diagnosis Date  . Gestational diabetes   . HTN (hypertension)    Past Surgical History:  Procedure Laterality Date  . ABDOMINAL HYSTERECTOMY     ovaries intact had 2011/2012 due to fibroids/DUB  . lsh     Family History  Problem Relation Age of Onset  . Breast cancer Mother 58  . Dementia Mother   . Diabetes Father   . Hypertension Father   . Diabetes Other        maternal family   . Hypertension Sister    Social History   Socioeconomic History  . Marital status: Married    Spouse name: Not on file  . Number of children: Not on file  . Years of education: Not on file  . Highest education level: Not on file  Occupational History  . Not on file  Tobacco Use  . Smoking status: Never Smoker  . Smokeless tobacco: Never Used  Substance and Sexual Activity  . Alcohol use: Never  . Drug use: Never  . Sexual activity: Yes  Other Topics Concern  . 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 Difficulty of Paying Living Expenses:   Food Insecurity:   . Worried About Programme researcher, broadcasting/film/video in the Last Year:   . Barista in the Last Year:   Transportation Needs:   .  Freight forwarder (Medical):   Marland Kitchen Lack of Transportation (Non-Medical):   Physical Activity:   . Days of Exercise per Week:   . Minutes of Exercise per Session:   Stress:   . Feeling of Stress :   Social Connections:   . Frequency of Communication with Friends and Family:   . Frequency of Social Gatherings with Friends and Family:   . Attends Religious Services:   . Active Member of Clubs or Organizations:   . Attends Banker Meetings:   Marland Kitchen Marital Status:   Intimate Partner Violence:   . Fear of Current or Ex-Partner:   . Emotionally Abused:   Marland Kitchen Physically Abused:   . Sexually Abused:    Current Meds  Medication Sig  . amLODipine (NORVASC) 5 MG tablet Take 1 tablet (5 mg total) by mouth daily.  Marland Kitchen atorvastatin (LIPITOR) 20 MG tablet Take 1 tablet (20 mg total) by mouth daily at 6 PM.  . BIOTIN PO Take by mouth.  . [DISCONTINUED] Cholecalciferol 1.25 MG (50000 UT) capsule Take 1 capsule (50,000 Units total) by mouth once a week.  Allergies  Allergen Reactions  . Sulfa Antibiotics     Hives    Recent Results (from the past 2160 hour(s))  Comprehensive metabolic panel     Status: Abnormal   Collection Time: 06/30/19  7:13 AM  Result Value Ref Range   Glucose 103 (H) 65 - 99 mg/dL   BUN 10 6 - 24 mg/dL   Creatinine, Ser 0.79 0.57 - 1.00 mg/dL   GFR calc non Af Amer 88 >59 mL/min/1.73   GFR calc Af Amer 101 >59 mL/min/1.73   BUN/Creatinine Ratio 13 9 - 23   Sodium 141 134 - 144 mmol/L   Potassium 4.3 3.5 - 5.2 mmol/L   Chloride 103 96 - 106 mmol/L   CO2 22 20 - 29 mmol/L   Calcium 9.5 8.7 - 10.2 mg/dL   Total Protein 6.7 6.0 - 8.5 g/dL   Albumin 4.3 3.8 - 4.8 g/dL   Globulin, Total 2.4 1.5 - 4.5 g/dL   Albumin/Globulin Ratio 1.8 1.2 - 2.2   Bilirubin Total 0.3 0.0 - 1.2 mg/dL   Alkaline Phosphatase 81 39 - 117 IU/L   AST 12 0 - 40 IU/L   ALT 10 0 - 32 IU/L  Lipid panel     Status: Abnormal   Collection Time: 06/30/19  7:13 AM  Result Value Ref  Range   Cholesterol, Total 216 (H) 100 - 199 mg/dL   Triglycerides 95 0 - 149 mg/dL   HDL 46 >39 mg/dL   VLDL Cholesterol Cal 17 5 - 40 mg/dL   LDL Chol Calc (NIH) 153 (H) 0 - 99 mg/dL   Chol/HDL Ratio 4.7 (H) 0.0 - 4.4 ratio    Comment:                                   T. Chol/HDL Ratio                                             Men  Women                               1/2 Avg.Risk  3.4    3.3                                   Avg.Risk  5.0    4.4                                2X Avg.Risk  9.6    7.1                                3X Avg.Risk 23.4   11.0   Vitamin D (25 hydroxy)     Status: None   Collection Time: 06/30/19  7:13 AM  Result Value Ref Range   Vit D, 25-Hydroxy 57.6 30.0 - 100.0 ng/mL    Comment: Vitamin D deficiency has been defined by the [place] practice guideline as a level of serum 25-OH vitamin D less than 20 ng/mL (1,2). The Endocrine  Society went on to further define vitamin D insufficiency as a level between 21 and 29 ng/mL (2). 1. IOM (Institute of Medicine). 2010. Dietary reference    intakes for calcium and D. [place]: The    [employer]. 2. Holick MF, Binkley Needles, Bischoff-Ferrari HA, et al.    Evaluation, treatment, and prevention of vitamin D    deficiency: an Endocrine Society clinical practice    guideline. JCEM. 2011 Jul; 96(7):1911-30.    Objective  Body mass index is 42.6 kg/m. Wt Readings from Last 3 Encounters:  07/18/19 248 lb 3.2 oz (112.6 kg)  02/28/19 243 lb 3.2 oz (110.3 kg)  02/14/19 246 lb (111.6 kg)   Temp Readings from Last 3 Encounters:  07/18/19 97.7 F (36.5 C) (Temporal)  02/28/19 98.4 F (36.9 C) (Temporal)  02/14/19 98.2 F (36.8 C) (Oral)   BP Readings from Last 3 Encounters:  07/18/19 (!) 150/86  02/28/19 126/78  02/14/19 136/76   Pulse Readings from Last 3 Encounters:  07/18/19 80  02/28/19 83  02/14/19 84    Physical Exam Vitals and nursing  note reviewed.  Constitutional:      Appearance: Normal appearance. She is well-developed and well-groomed. She is obese.  HENT:     Head: Normocephalic and atraumatic.  Eyes:     Conjunctiva/sclera: Conjunctivae normal.     Pupils: Pupils are equal, round, and reactive to light.  Cardiovascular:     Rate and Rhythm: Normal rate and regular rhythm.     Heart sounds: Normal heart sounds. No murmur.  Pulmonary:     Effort: Pulmonary effort is normal.     Breath sounds: Normal breath sounds.  Skin:    General: Skin is warm and dry.  Neurological:     General: No focal deficit present.     Mental Status: She is alert and oriented to person, place, and time. Mental status is at baseline.     Gait: Gait normal.  Psychiatric:        Attention and Perception: Attention and perception normal.        Mood and Affect: Mood and affect normal.        Speech: Speech normal.        Behavior: Behavior normal. Behavior is cooperative.        Thought Content: Thought content normal.        Cognition and Memory: Cognition and memory normal.        Judgment: Judgment normal.     Assessment  Plan  Essential hypertension - Plan: telmisartan (MICARDIS) 20 MG tablet, Comprehensive metabolic panel, Lipid panel, CBC with Differential/Platelet Cont norvasc 5 mg   OSA on CPAP  Vitamin D deficiency rec D3 2000-4000 IU qd   Hyperglycemia - Plan: Hemoglobin A1c   HM Flu shot utd  Tdap utd 01/09/19  Consider shingrix in future  covid vx 2/2   mammo 01/23/2019 negative ordered again  Pap 10/04/17 negative Dr. Tiburcio Pea ob/gyn  Colonoscopy [facility] 08/25/19  Skin left back skin tag removed sent to [facility]  rec healthy diet and exercise   Provider: Dr. French Ana McLean-Scocuzza-Internal Medicine

## 2019-08-01 ENCOUNTER — Other Ambulatory Visit: Payer: Self-pay | Admitting: Internal Medicine

## 2019-08-01 DIAGNOSIS — I10 Essential (primary) hypertension: Secondary | ICD-10-CM

## 2019-08-01 DIAGNOSIS — E785 Hyperlipidemia, unspecified: Secondary | ICD-10-CM

## 2019-08-01 MED ORDER — TELMISARTAN 20 MG PO TABS: 20.0000 mg | ORAL_TABLET | Freq: Every day | ORAL | 3 refills | Status: DC

## 2019-08-01 MED ORDER — AMLODIPINE BESYLATE 5 MG PO TABS: 5.0000 mg | ORAL_TABLET | Freq: Every day | ORAL | 3 refills | Status: DC

## 2019-08-01 MED ORDER — ATORVASTATIN CALCIUM 20 MG PO TABS: 20.0000 mg | ORAL_TABLET | Freq: Every day | ORAL | 3 refills | Status: DC

## 2019-08-23 ENCOUNTER — Other Ambulatory Visit
Admission: RE | Admit: 2019-08-23 | Discharge: 2019-08-23 | Disposition: A | Discharge status: Home / Self Care | Payer: 59 | Source: Ambulatory Visit | Attending: Gastroenterology | Admitting: Gastroenterology

## 2019-08-23 DIAGNOSIS — Z20822 Contact with and (suspected) exposure to covid-19: Secondary | ICD-10-CM | POA: Diagnosis not present

## 2019-08-23 DIAGNOSIS — Z01812 Encounter for preprocedural laboratory examination: Secondary | ICD-10-CM | POA: Diagnosis not present

## 2019-08-23 LAB — SARS CORONAVIRUS 2 (TAT 6-24 HRS): SARS Coronavirus 2: NEGATIVE

## 2019-08-25 ENCOUNTER — Encounter
Admission: RE | Disposition: A | Discharge status: Home / Self Care | Payer: Self-pay | Source: Home / Self Care | Attending: Gastroenterology

## 2019-08-25 ENCOUNTER — Other Ambulatory Visit: Payer: Self-pay

## 2019-08-25 ENCOUNTER — Encounter: Payer: Self-pay | Admitting: Gastroenterology

## 2019-08-25 ENCOUNTER — Ambulatory Visit: Payer: 59 | Admitting: Anesthesiology

## 2019-08-25 ENCOUNTER — Ambulatory Visit
Admission: RE | Admit: 2019-08-25 | Discharge: 2019-08-25 | Disposition: A | Discharge status: Home / Self Care | Payer: 59 | Attending: Gastroenterology | Admitting: Gastroenterology

## 2019-08-25 DIAGNOSIS — Z882 Allergy status to sulfonamides status: Secondary | ICD-10-CM | POA: Diagnosis not present

## 2019-08-25 DIAGNOSIS — Z79899 Other long term (current) drug therapy: Secondary | ICD-10-CM | POA: Diagnosis not present

## 2019-08-25 DIAGNOSIS — G4730 Sleep apnea, unspecified: Secondary | ICD-10-CM | POA: Insufficient documentation

## 2019-08-25 DIAGNOSIS — I10 Essential (primary) hypertension: Secondary | ICD-10-CM | POA: Diagnosis not present

## 2019-08-25 DIAGNOSIS — Z8249 Family history of ischemic heart disease and other diseases of the circulatory system: Secondary | ICD-10-CM | POA: Diagnosis not present

## 2019-08-25 DIAGNOSIS — K635 Polyp of colon: Secondary | ICD-10-CM | POA: Diagnosis not present

## 2019-08-25 DIAGNOSIS — Z1211 Encounter for screening for malignant neoplasm of colon: Secondary | ICD-10-CM

## 2019-08-25 HISTORY — PX: COLONOSCOPY WITH PROPOFOL: SHX5780

## 2019-08-25 SURGERY — COLONOSCOPY WITH PROPOFOL
Anesthesia: General

## 2019-08-25 MED ORDER — PROPOFOL 10 MG/ML IV BOLUS
INTRAVENOUS | Status: DC | PRN
  Administered 2019-08-25 (×4): 50 mg via INTRAVENOUS

## 2019-08-25 MED ORDER — PROPOFOL 500 MG/50ML IV EMUL
INTRAVENOUS | Status: DC | PRN
  Administered 2019-08-25: 125 ug/kg/min via INTRAVENOUS

## 2019-08-25 MED ORDER — LIDOCAINE HCL (CARDIAC) PF 100 MG/5ML IV SOSY
PREFILLED_SYRINGE | INTRAVENOUS | Status: DC | PRN
  Administered 2019-08-25: 30 mg via INTRAVENOUS

## 2019-08-25 MED ORDER — PROPOFOL 500 MG/50ML IV EMUL
INTRAVENOUS | Status: AC
  Filled 2019-08-25: qty 50

## 2019-08-25 MED ORDER — SODIUM CHLORIDE 0.9 % IV SOLN
INTRAVENOUS | Status: DC
  Administered 2019-08-25: 1000 mL via INTRAVENOUS

## 2019-08-25 NOTE — H&P (Signed)
Wyline Mood, MD [address], [place], [place], [address] [address], [address], [place], [place], [address] Phone: [phone]  Fax: [phone]  Primary Care Physician:  McLean-Scocuzza, Pasty Spillers, MD   Pre-Procedure History & Physical: HPI:  Donna Hinton is a 49 y.o. female is here for an colonoscopy.   Past Medical History:  Diagnosis Date  . HTN (hypertension)     Past Surgical History:  Procedure Laterality Date  . ABDOMINAL HYSTERECTOMY     ovaries intact had 2011/2012 due to fibroids/DUB  . lsh      Prior to Admission medications   Medication Sig Start Date End Date Taking? Authorizing Provider  amLODipine (NORVASC) 5 MG tablet Take 1 tablet (5 mg total) by mouth daily. 08/01/19  Yes McLean-Scocuzza, Pasty Spillers, MD  atorvastatin (LIPITOR) 20 MG tablet Take 1 tablet (20 mg total) by mouth daily at 6 PM. 08/01/19  Yes McLean-Scocuzza, Pasty Spillers, MD  BIOTIN PO Take by mouth.   Yes [provider]  telmisartan (MICARDIS) 20 MG tablet Take 1 tablet (20 mg total) by mouth daily. 08/01/19  Yes McLean-Scocuzza, Pasty Spillers, MD    Allergies as of 07/10/2019 - Review Complete 05/17/2019  Allergen Reaction Noted  . Sulfa antibiotics  10/04/2017    Family History  Problem Relation Age of Onset  . Breast cancer Mother 75  . Dementia Mother   . Diabetes Father   . Hypertension Father   . Diabetes Other        maternal family   . Hypertension Sister     Social History   Socioeconomic History  . Marital status: Married    Spouse name: Not on file  . Number of children: Not on file  . Years of education: Not on file  . Highest education level: Not on file  Occupational History  . Not on file  Tobacco Use  . Smoking status: Never Smoker  . Smokeless tobacco: Never Used  Substance and Sexual Activity  . Alcohol use: Never  . Drug use: Never  . Sexual activity: Yes  Other Topics Concern  . 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 Difficulty of Paying Living Expenses:   Food Insecurity:   . Worried About Programme researcher, broadcasting/film/video in the Last Year:   . Barista in the Last Year:   Transportation Needs:   . Freight forwarder (Medical):   Marland Kitchen Lack of Transportation (Non-Medical):   Physical Activity:   . Days of Exercise per Week:   . Minutes of Exercise per Session:   Stress:   . Feeling of Stress :   Social Connections:   . Frequency of Communication with Friends and Family:   . Frequency of Social Gatherings with Friends and Family:   . Attends Religious Services:   . Active Member of Clubs or Organizations:   . Attends Banker Meetings:   Marland Kitchen Marital Status:   Intimate Partner Violence:   . Fear  of Current or Ex-Partner:   . Emotionally Abused:   Marland Kitchen Physically Abused:   . Sexually Abused:     Review of Systems: See HPI, otherwise negative ROS  Physical Exam: BP (!) 170/96   Pulse 87   Temp (!) 97.3 F (36.3 C) (Temporal)   Resp 20   Ht 5\' 6"  (1.676 m)   Wt 109.1 kg   SpO2 100%   BMI 38.82 kg/m  General:   Alert,  pleasant and cooperative in NAD Head:  Normocephalic and atraumatic. Neck:  Supple; no masses or thyromegaly. Lungs:  Clear throughout to auscultation, normal respiratory effort.    Heart:  +S1, +S2, Regular rate and rhythm, No edema. Abdomen:  Soft, nontender and nondistended. Normal bowel sounds, without guarding, and without rebound.   Neurologic:  Alert and  oriented x4;  grossly normal neurologically.  Impression/Plan: Donna Hinton is here for an colonoscopy to be performed for Screening colonoscopy average risk   Risks, benefits, limitations, and alternatives  regarding  colonoscopy have been reviewed with the patient.  Questions have been answered.  All parties agreeable.   Jonathon Bellows, MD  08/25/2019, 11:30 AM

## 2019-08-25 NOTE — Transfer of Care (Signed)
Immediate Anesthesia Transfer of Care Note  Patient: Donna Hinton  Procedure(s) Performed: COLONOSCOPY WITH PROPOFOL (N/A )  Patient Location: PACU and Endoscopy Unit  Anesthesia Type:General  Level of Consciousness: awake  Airway & Oxygen Therapy: Patient Spontanous Breathing  Post-op Assessment: Report given to RN  Post vital signs: stable  Last Vitals:  Vitals Value Taken Time  BP 110/87 08/25/19 1301  Temp 36.4 C 08/25/19 1301  Pulse 81 08/25/19 1302  Resp 15 08/25/19 1302  SpO2 96 % 08/25/19 1302  Vitals shown include unvalidated device data.  Last Pain:  Vitals:   08/25/19 1301  TempSrc: Temporal  PainSc:          Complications: No apparent anesthesia complications

## 2019-08-25 NOTE — Anesthesia Preprocedure Evaluation (Signed)
Anesthesia Evaluation  Patient identified by MRN, date of birth, ID band Patient awake    Reviewed: Allergy & Precautions, NPO status , Patient's Chart, lab work & pertinent test results  History of Anesthesia Complications Negative for: history of anesthetic complications  Airway Mallampati: III       Dental   Pulmonary sleep apnea and Continuous Positive Airway Pressure Ventilation , neg COPD, Not current smoker,           Cardiovascular hypertension, Pt. on medications (-) Past MI and (-) CHF (-) dysrhythmias (-) Valvular Problems/Murmurs     Neuro/Psych neg Seizures    GI/Hepatic Neg liver ROS, neg GERD  ,  Endo/Other  neg diabetes  Renal/GU negative Renal ROS     Musculoskeletal   Abdominal   Peds  Hematology   Anesthesia Other Findings   Reproductive/Obstetrics                             Anesthesia Physical Anesthesia Plan  ASA: III  Anesthesia Plan: General   Post-op Pain Management:    Induction: Intravenous  PONV Risk Score and Plan: 3 and Propofol infusion, TIVA and Treatment may vary due to age or medical condition  Airway Management Planned: Nasal Cannula  Additional Equipment:   Intra-op Plan:   Post-operative Plan:   Informed Consent: I have reviewed the patients History and Physical, chart, labs and discussed the procedure including the risks, benefits and alternatives for the proposed anesthesia with the patient or authorized representative who has indicated his/her understanding and acceptance.       Plan Discussed with:   Anesthesia Plan Comments:         Anesthesia Quick Evaluation

## 2019-08-25 NOTE — Anesthesia Postprocedure Evaluation (Signed)
Anesthesia Post Note  Patient: Donna Hinton  Procedure(s) Performed: COLONOSCOPY WITH PROPOFOL (N/A )  Patient location during evaluation: Endoscopy Anesthesia Type: General Level of consciousness: awake and alert Pain management: pain level controlled Vital Signs Assessment: post-procedure vital signs reviewed and stable Respiratory status: spontaneous breathing and respiratory function stable Cardiovascular status: stable Anesthetic complications: no     Last Vitals:  Vitals:   08/25/19 1301 08/25/19 1311  BP: 110/87 (!) 141/67  Pulse:    Resp:    Temp: (!) 36.4 C   SpO2:      Last Pain:  Vitals:   08/25/19 1311  TempSrc:   PainSc: 0-No pain                 Clovis Mankins K

## 2019-08-25 NOTE — Op Note (Signed)
[facility] Patient Name: Donna Hinton Procedure Date: 08/25/2019 11:31 AM MRN: [id number] Account #: [id number] Date of Birth: [birth year] Admit Type: Outpatient Age: 49 Room: [facility] ENDO ROOM 3 Gender: Female Note Status: Finalized Procedure:             Colonoscopy Indications:           Screening for colorectal malignant neoplasm Providers:             Wyline Mood MD, MD Medicines:             Monitored Anesthesia Care Complications:         No immediate complications. Procedure:             Pre-Anesthesia Assessment:                        - Prior to the procedure, a History and Physical was                         performed, and patient medications, allergies and                         sensitivities were reviewed. The patient's tolerance                         of previous anesthesia was reviewed.                        - The risks and benefits of the procedure and the                         sedation options and risks were discussed with the                         patient. All questions were answered and informed                         consent was obtained.                        - ASA Grade Assessment: II - A patient with mild                         systemic disease.                        After obtaining informed consent, the colonoscope was                         passed under direct vision. Throughout the procedure,                         the patient's blood pressure, pulse, and oxygen                         saturations were monitored continuously. The                         Colonoscope was introduced through the anus and  advanced to the the cecum, identified by the                         appendiceal orifice. The Colonoscope was introduced                         through the and advanced to the. The colonoscopy was                         performed with ease. The patient tolerated the    procedure well. The quality of the bowel preparation                         was good. Findings:      The perianal and digital rectal examinations were normal.      Two sessile polyps were found in the ascending colon. The polyps were 3       to 4 mm in size. These polyps were removed with a cold biopsy forceps.       Resection and retrieval were complete.      A 5 mm polyp was found in the descending colon. The polyp was sessile.       The polyp was removed with a cold snare. Resection and retrieval were       complete.      The exam was otherwise without abnormality on direct and retroflexion       views. Impression:            - Two 3 to 4 mm polyps in the ascending colon, removed                         with a cold biopsy forceps. Resected and retrieved.                        - One 5 mm polyp in the descending colon, removed with                         a cold snare. Resected and retrieved.                        - The examination was otherwise normal on direct and                         retroflexion views. Recommendation:        - Discharge patient to home (with escort).                        - Resume previous diet.                        - Continue present medications.                        - Await pathology results.                        - Repeat colonoscopy for surveillance based on                         pathology results. Procedure Code(s):     ---  Professional ---                        [phone], Colonoscopy, flexible; with removal of                         tumor(s), polyp(s), or other lesion(s) by snare                         technique                        45380, 59, Colonoscopy, flexible; with biopsy, single                         or multiple Diagnosis Code(s):     --- Professional ---                        K63.5, Polyp of colon                        Z12.11, Encounter for screening for malignant neoplasm                         of colon CPT copyright 2019  American Medical Association. All rights reserved. The codes documented in this report are preliminary and upon coder review may  be revised to meet current compliance requirements. Jonathon Bellows, MD Jonathon Bellows MD, MD 08/25/2019 12:59:17 PM This report has been signed electronically. Number of Addenda: 0 Note Initiated On: 08/25/2019 11:31 AM Scope Withdrawal Time: 0 hours 18 minutes 51 seconds  Total Procedure Duration: 0 hours 22 minutes 16 seconds  Estimated Blood Loss:  Estimated blood loss: none.      [facility]

## 2019-08-28 ENCOUNTER — Encounter: Payer: Self-pay | Admitting: *Deleted

## 2019-08-28 LAB — SURGICAL PATHOLOGY

## 2019-08-31 ENCOUNTER — Encounter: Payer: Self-pay | Admitting: Gastroenterology

## 2019-09-07 ENCOUNTER — Encounter: Payer: Self-pay | Admitting: Internal Medicine

## 2019-10-31 ENCOUNTER — Encounter: Payer: Self-pay | Admitting: Internal Medicine

## 2019-11-02 LAB — COMPREHENSIVE METABOLIC PANEL
ALT: 12 IU/L (ref 0–32)
AST: 14 IU/L (ref 0–40)
Albumin/Globulin Ratio: 1.5 (ref 1.2–2.2)
Albumin: 4 g/dL (ref 3.8–4.8)
Alkaline Phosphatase: 89 IU/L (ref 48–121)
BUN/Creatinine Ratio: 16 (ref 9–23)
BUN: 16 mg/dL (ref 6–24)
Bilirubin Total: 0.3 mg/dL (ref 0.0–1.2)
CO2: 25 mmol/L (ref 20–29)
Calcium: 9.5 mg/dL (ref 8.7–10.2)
Chloride: 102 mmol/L (ref 96–106)
Creatinine, Ser: 0.98 mg/dL (ref 0.57–1.00)
GFR calc Af Amer: 78 mL/min/{1.73_m2} (ref >59)
GFR calc non Af Amer: 67 mL/min/{1.73_m2} (ref >59)
Globulin, Total: 2.6 g/dL (ref 1.5–4.5)
Glucose: 98 mg/dL (ref 65–99)
Potassium: 4.6 mmol/L (ref 3.5–5.2)
Sodium: 138 mmol/L (ref 134–144)
Total Protein: 6.6 g/dL (ref 6.0–8.5)

## 2019-11-02 LAB — CBC WITH DIFFERENTIAL/PLATELET
Basophils Absolute: 0 10*3/uL (ref 0.0–0.2)
Basos: 0 %
EOS (ABSOLUTE): 0.2 10*3/uL (ref 0.0–0.4)
Eos: 2 %
Hematocrit: 37.8 % (ref 34.0–46.6)
Hemoglobin: 12.8 g/dL (ref 11.1–15.9)
Immature Grans (Abs): 0 10*3/uL (ref 0.0–0.1)
Immature Granulocytes: 0 %
Lymphocytes Absolute: 3.9 10*3/uL — ABNORMAL HIGH (ref 0.7–3.1)
Lymphs: 40 %
MCH: 29 pg (ref 26.6–33.0)
MCHC: 33.9 g/dL (ref 31.5–35.7)
MCV: 86 fL (ref 79–97)
Monocytes Absolute: 0.7 10*3/uL (ref 0.1–0.9)
Monocytes: 8 %
Neutrophils Absolute: 4.8 10*3/uL (ref 1.4–7.0)
Neutrophils: 50 %
Platelets: 277 10*3/uL (ref 150–450)
RBC: 4.42 x10E6/uL (ref 3.77–5.28)
RDW: 13.2 % (ref 11.7–15.4)
WBC: 9.7 10*3/uL (ref 3.4–10.8)

## 2019-11-02 LAB — LIPID PANEL
Chol/HDL Ratio: 4.2 ratio (ref 0.0–4.4)
Cholesterol, Total: 187 mg/dL (ref 100–199)
HDL: 45 mg/dL (ref >39)
LDL Chol Calc (NIH): 124 mg/dL — ABNORMAL HIGH (ref 0–99)
Triglycerides: 101 mg/dL (ref 0–149)
VLDL Cholesterol Cal: 18 mg/dL (ref 5–40)

## 2019-11-02 LAB — HEMOGLOBIN A1C
Est. average glucose Bld gHb Est-mCnc: 120 mg/dL
Hgb A1c MFr Bld: 5.8 % — ABNORMAL HIGH (ref 4.8–5.6)

## 2019-11-03 ENCOUNTER — Telehealth: Payer: Self-pay | Admitting: Internal Medicine

## 2019-11-03 NOTE — Telephone Encounter (Signed)
Pt was returning call 

## 2019-11-06 NOTE — Telephone Encounter (Signed)
Donna Hinton, CMA  11/02/2019 3:26 PM EDT Back to Top    Left message to call back   [name], MD  11/02/2019 11:03 AM EDT     Liver kidneys normal  Cholesterol improved but still need ldl<100  -try lifestyle changes and exercise   -what is BP?  Blood cts normal  A1C 5.8=prediabetes

## 2019-11-07 NOTE — Telephone Encounter (Signed)
Patient informed and verbalized understanding.   139/89 was most recent Blood pressure. Has not been able to check again at home as her machine is giving error messages.

## 2019-11-12 ENCOUNTER — Other Ambulatory Visit: Payer: Self-pay | Admitting: Internal Medicine

## 2019-11-12 DIAGNOSIS — I10 Essential (primary) hypertension: Secondary | ICD-10-CM

## 2019-11-12 MED ORDER — TELMISARTAN 40 MG PO TABS: 40.0000 mg | ORAL_TABLET | Freq: Every day | ORAL | 3 refills | Status: DC

## 2019-11-12 NOTE — Telephone Encounter (Signed)
BP still too high goal <130/<80  She can order automatic upper arm BP cuff from [facility] to the correct size to fit her upper arm  Increase telmisartan to 40 mg from 20 mg and [facility]  If has any 20 mg telmisartan she can take 2 20 to =40 mg

## 2019-11-13 NOTE — Telephone Encounter (Signed)
Patient informed and verbalized understanding.  State she will get a new cuff and will double up on the 20 mg until she runs out.

## 2020-03-01 IMAGING — MG MM DIGITAL SCREENING BILAT W/ TOMO W/ CAD
8 series · 8 of 24 positions shown · non-contrast
Comparison: Previous exam(s).

CLINICAL DATA: Screening.

EXAM:
DIGITAL SCREENING BILATERAL MAMMOGRAM WITH TOMO AND CAD

[R CC synth-2D]
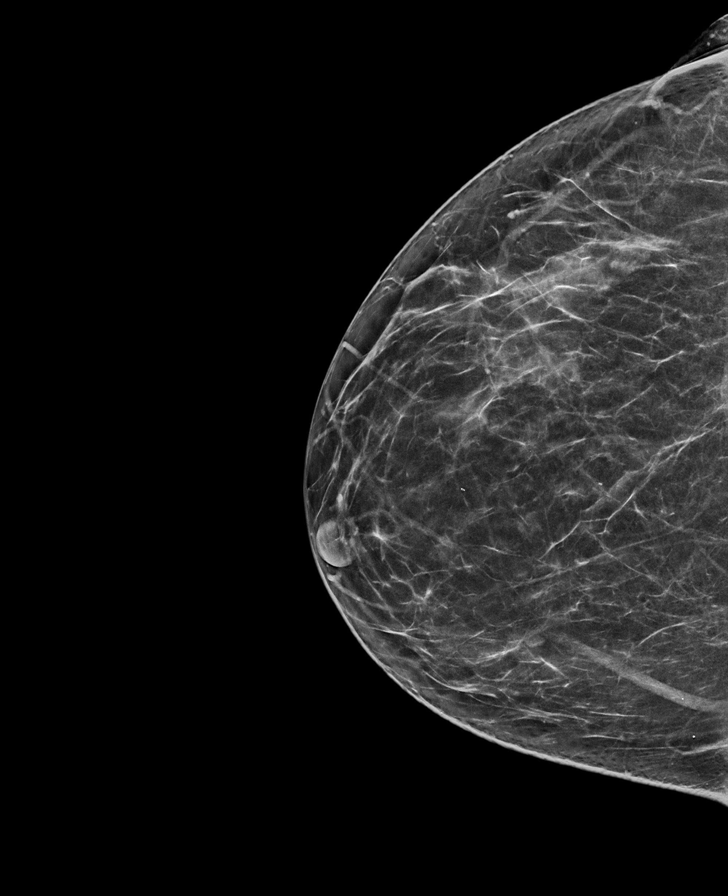

[R MLO synth-2D]
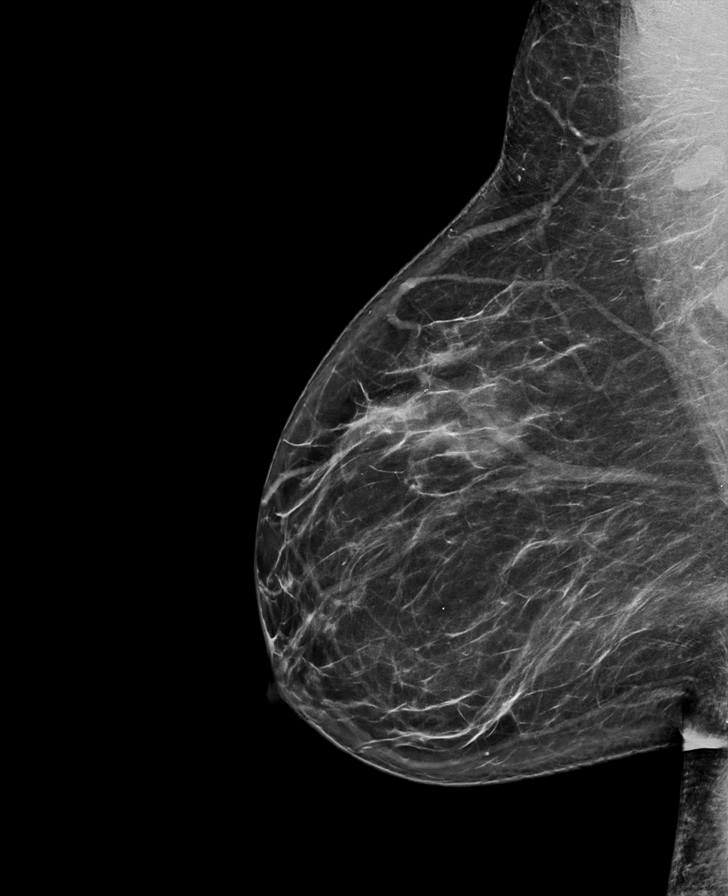

[L MLO synth-2D]
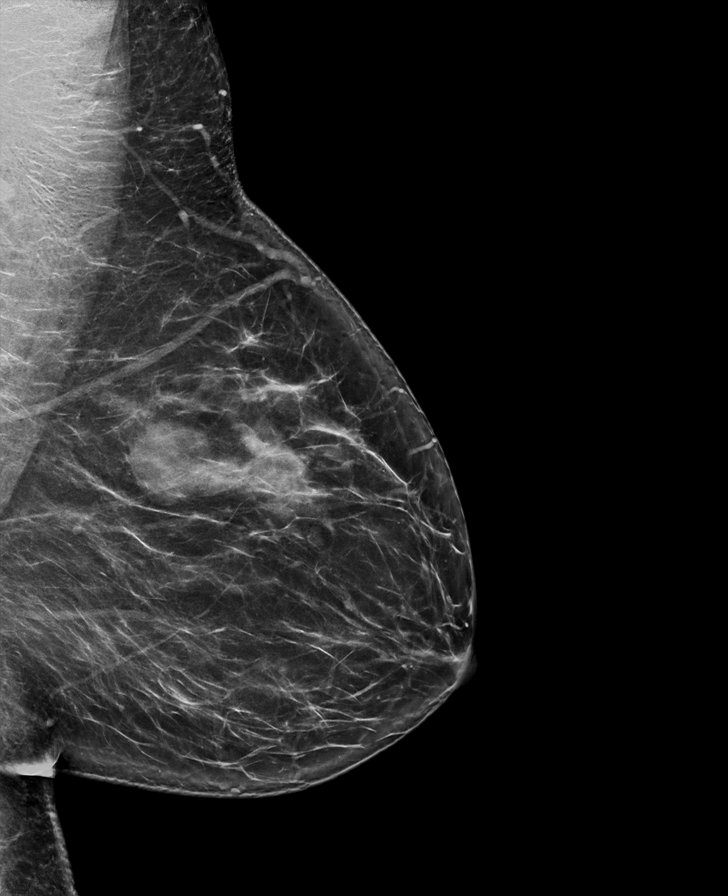

[L CC synth-2D]
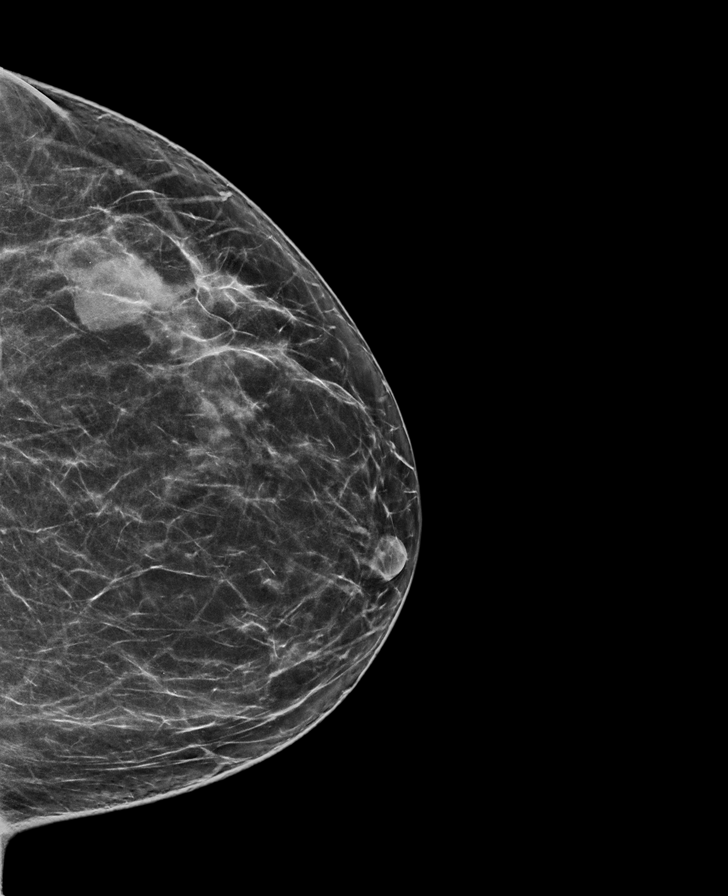

[L CC tomo · tomo slice 35/70.0]
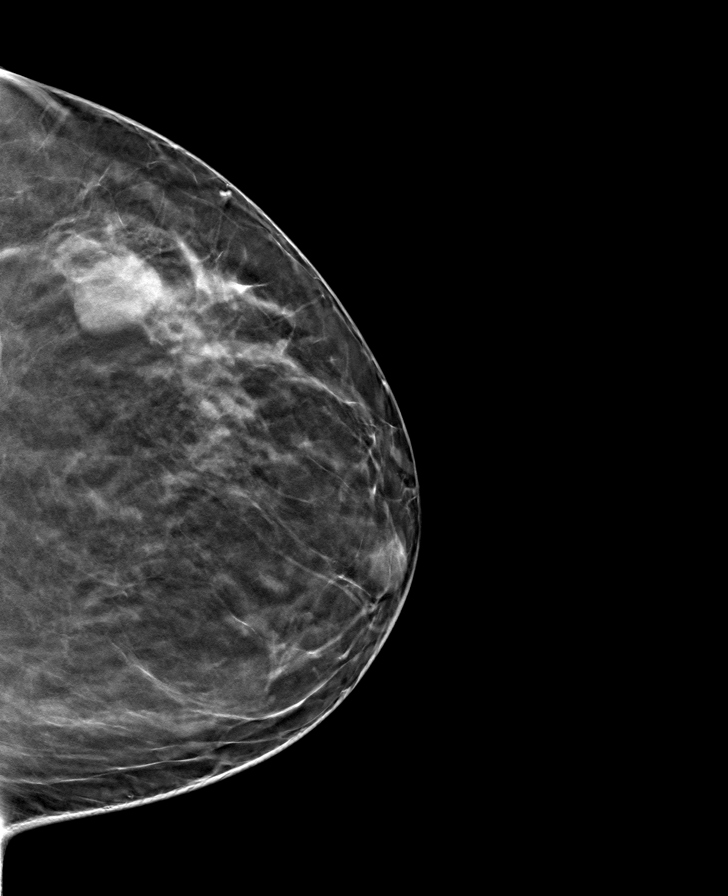

[R CC tomo · tomo slice 35/69.0]
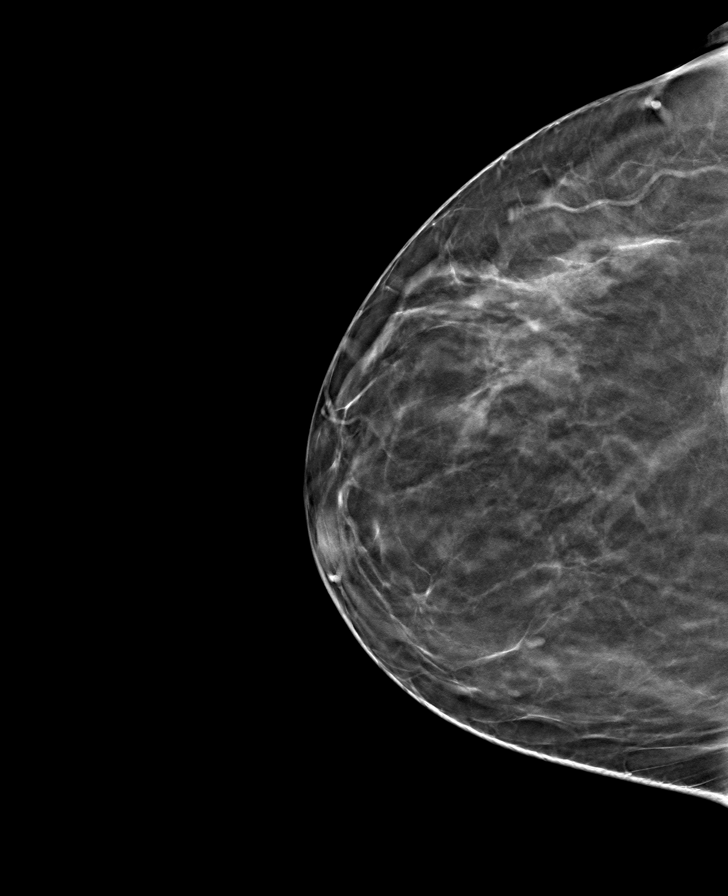

[R MLO tomo · tomo slice 39/78.0]
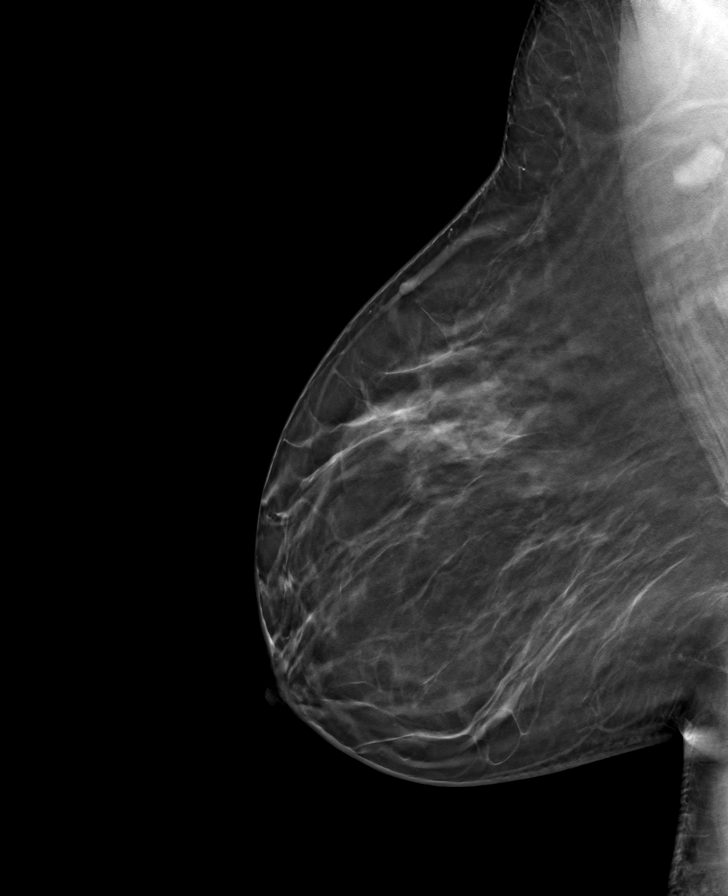

[L MLO tomo · tomo slice 41/80.0]
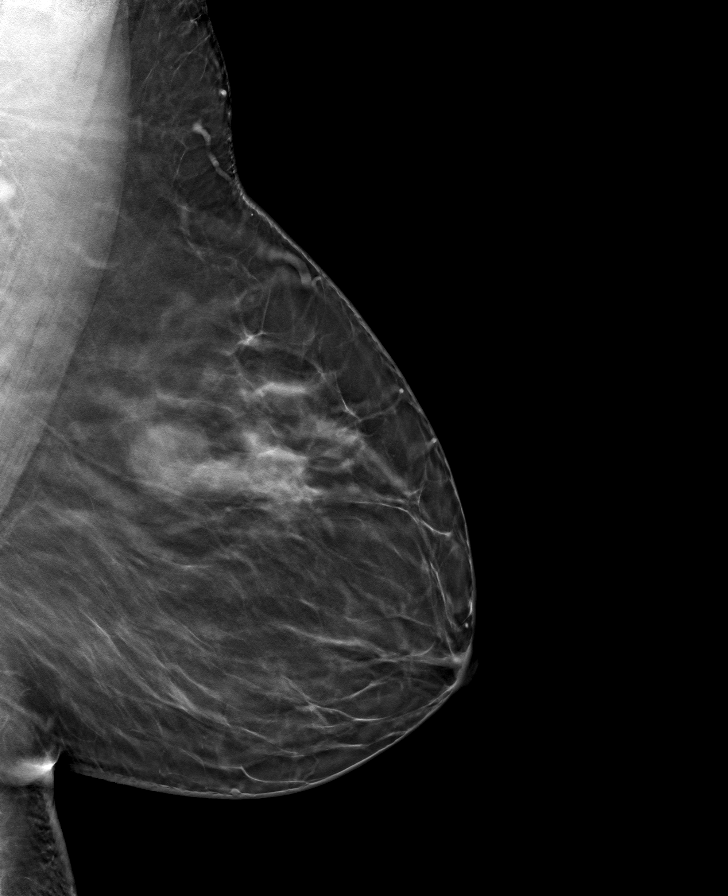

[8 of 24 positions shown; findings below may reference images not displayed]

ACR Breast Density Category b: There are scattered areas of
fibroglandular density.
FINDINGS: There are no findings suspicious for malignancy. Images were
processed with CAD.
IMPRESSION: No mammographic evidence of malignancy. A result letter of this
screening mammogram will be mailed directly to the patient.

RECOMMENDATION:
Screening mammogram in one year. (Code:CN-U-775)

BI-RADS CATEGORY  1: Negative.

## 2020-03-27 ENCOUNTER — Other Ambulatory Visit: Payer: Self-pay

## 2020-03-27 ENCOUNTER — Ambulatory Visit
Admission: RE | Admit: 2020-03-27 | Discharge: 2020-03-27 | Disposition: A | Discharge status: Home / Self Care | Payer: 59 | Source: Ambulatory Visit | Attending: Internal Medicine | Admitting: Internal Medicine

## 2020-03-27 DIAGNOSIS — Z1231 Encounter for screening mammogram for malignant neoplasm of breast: Secondary | ICD-10-CM | POA: Insufficient documentation

## 2020-06-18 ENCOUNTER — Other Ambulatory Visit: Payer: Self-pay | Admitting: Internal Medicine

## 2020-06-18 DIAGNOSIS — E785 Hyperlipidemia, unspecified: Secondary | ICD-10-CM

## 2020-06-18 DIAGNOSIS — I10 Essential (primary) hypertension: Secondary | ICD-10-CM

## 2020-07-16 ENCOUNTER — Encounter: Payer: Self-pay | Admitting: Internal Medicine

## 2020-07-17 ENCOUNTER — Ambulatory Visit (INDEPENDENT_AMBULATORY_CARE_PROVIDER_SITE_OTHER): Payer: 59 | Admitting: Internal Medicine

## 2020-07-17 ENCOUNTER — Other Ambulatory Visit: Payer: Self-pay

## 2020-07-17 ENCOUNTER — Encounter: Payer: Self-pay | Admitting: Internal Medicine

## 2020-07-17 VITALS — Temp 98.2°F | Ht 66.0 in | Wt 238.2 lb

## 2020-07-17 DIAGNOSIS — R42 Dizziness and giddiness: Secondary | ICD-10-CM

## 2020-07-17 DIAGNOSIS — F419 Anxiety disorder, unspecified: Secondary | ICD-10-CM | POA: Diagnosis not present

## 2020-07-17 DIAGNOSIS — Z1389 Encounter for screening for other disorder: Secondary | ICD-10-CM

## 2020-07-17 DIAGNOSIS — Z1329 Encounter for screening for other suspected endocrine disorder: Secondary | ICD-10-CM

## 2020-07-17 DIAGNOSIS — I10 Essential (primary) hypertension: Secondary | ICD-10-CM | POA: Diagnosis not present

## 2020-07-17 DIAGNOSIS — R7303 Prediabetes: Secondary | ICD-10-CM

## 2020-07-17 DIAGNOSIS — E559 Vitamin D deficiency, unspecified: Secondary | ICD-10-CM

## 2020-07-17 DIAGNOSIS — Z1231 Encounter for screening mammogram for malignant neoplasm of breast: Secondary | ICD-10-CM | POA: Diagnosis not present

## 2020-07-17 DIAGNOSIS — Z0000 Encounter for general adult medical examination without abnormal findings: Secondary | ICD-10-CM

## 2020-07-17 DIAGNOSIS — D72820 Lymphocytosis (symptomatic): Secondary | ICD-10-CM

## 2020-07-17 MED ORDER — SERTRALINE HCL 25 MG PO TABS: 25.0000 mg | ORAL_TABLET | Freq: Every day | ORAL | 3 refills | Status: DC

## 2020-07-17 MED ORDER — AMLODIPINE BESYLATE 2.5 MG PO TABS: 2.5000 mg | ORAL_TABLET | Freq: Two times a day (BID) | ORAL | 3 refills | Status: DC

## 2020-07-17 NOTE — Patient Instructions (Addendum)
Ok to see daughter sch NP appt and given NP paperwork please  Water  coppertone compression stockings to knees    Dizziness Dizziness is a common problem. It is a feeling of unsteadiness or light-headedness. You may feel like you are about to faint. Dizziness can lead to injury if you stumble or fall. Anyone can become dizzy, but dizziness is more common in older adults. This condition can be caused by a number of things, including medicines, dehydration, or illness. Follow these instructions at home: Eating and drinking  Drink enough fluid to keep your urine clear or pale yellow. This helps to keep you from becoming dehydrated. Try to drink more clear fluids, such as water.  Do not drink alcohol.  Limit your caffeine intake if told to do so by your health care provider. Check ingredients and nutrition facts to see if a food or beverage contains caffeine.  Limit your salt (sodium) intake if told to do so by your health care provider. Check ingredients and nutrition facts to see if a food or beverage contains sodium. Activity  Avoid making quick movements. ? Rise slowly from chairs and steady yourself until you feel okay. ? In the morning, first sit up on the side of the bed. When you feel okay, stand slowly while you hold onto something until you know that your balance is fine.  If you need to stand in one place for a long time, move your legs often. Tighten and relax the muscles in your legs while you are standing.  Do not drive or use heavy machinery if you feel dizzy.  Avoid bending down if you feel dizzy. Place items in your home so that they are easy for you to reach without leaning over. Lifestyle  Do not use any products that contain nicotine or tobacco, such as cigarettes and e-cigarettes. If you need help quitting, ask your health care provider.  Try to reduce your stress level by using methods such as yoga or meditation. Talk with your health care provider if you need help  to manage your stress. General instructions  Watch your dizziness for any changes.  Take over-the-counter and prescription medicines only as told by your health care provider. Talk with your health care provider if you think that your dizziness is caused by a medicine that you are taking.  Tell a friend or a family member that you are feeling dizzy. If he or she notices any changes in your behavior, have this person call your health care provider.  Keep all follow-up visits as told by your health care provider. This is important. Contact a health care provider if:  Your dizziness does not go away.  Your dizziness or light-headedness gets worse.  You feel nauseous.  You have reduced hearing.  You have new symptoms.  You are unsteady on your feet or you feel like the room is spinning. Get help right away if:  You vomit or have diarrhea and are unable to eat or drink anything.  You have problems talking, walking, swallowing, or using your arms, hands, or legs.  You feel generally weak.  You are not thinking clearly or you have trouble forming sentences. It may take a friend or family member to notice this.  You have chest pain, abdominal pain, shortness of breath, or sweating.  Your vision changes.  You have any bleeding.  You have a severe headache.  You have neck pain or a stiff neck.  You have a fever. These symptoms  may represent a serious problem that is an emergency. Do not wait to see if the symptoms will go away. Get medical help right away. Call your local emergency services (911 in the [place] Do not drive yourself to the hospital. Summary  Dizziness is a feeling of unsteadiness or light-headedness. This condition can be caused by a number of things, including medicines, dehydration, or illness.  Anyone can become dizzy, but dizziness is more common in older adults.  Drink enough fluid to keep your urine clear or pale yellow. Do not drink alcohol.  Avoid  making quick movements if you feel dizzy. Monitor your dizziness for any changes. This information is not intended to replace advice given to you by your health care provider. Make sure you discuss any questions you have with your health care provider. Document Revised: 04/16/2017 Document Reviewed: 05/16/2016 Elsevier Patient Education  2021 [employer]  Orthostatic Hypotension Blood pressure is a measurement of how strongly, or weakly, your blood is pressing against the walls of your arteries. Orthostatic hypotension is a sudden drop in blood pressure that happens when you quickly change positions, such as when you get up from sitting or lying down. Arteries are blood vessels that carry blood from your heart throughout your body. When blood pressure is too low, you may not get enough blood to your brain or to the rest of your organs. This can cause weakness, light-headedness, rapid heartbeat, and fainting. This can last for just a few seconds or for up to a few minutes. Orthostatic hypotension is usually not a serious problem. However, if it happens frequently or gets worse, it may be a sign of something more serious. What are the causes? This condition may be caused by:  Sudden changes in posture, such as standing up quickly after you have been sitting or lying down.  Blood loss.  Loss of body fluids (dehydration).  Heart problems.  Hormone (endocrine) problems.  Pregnancy.  Severe infection.  Lack of certain nutrients.  Severe allergic reactions (anaphylaxis).  Certain medicines, such as blood pressure medicine or medicines that make the body lose excess fluids (diuretics). Sometimes, this condition can be caused by not taking medicine as directed, such as taking too much of a certain medicine. What increases the risk? The following factors may make you more likely to develop this condition:  Age. Risk increases as you get older.  Conditions that affect the heart or the  central nervous system.  Taking certain medicines, such as blood pressure medicine or diuretics.  Being pregnant. What are the signs or symptoms? Symptoms of this condition may include:  Weakness.  Light-headedness.  Dizziness.  Blurred vision.  Fatigue.  Rapid heartbeat.  Fainting, in severe cases. How is this diagnosed? This condition is diagnosed based on:  Your medical history.  Your symptoms.  Your blood pressure measurement. Your health care provider will check your blood pressure when you are: ? Lying down. ? Sitting. ? Standing. A blood pressure reading is recorded as two numbers, such as "120 over 80" (or 120/80). The first ("top") number is called the systolic pressure. It is a measure of the pressure in your arteries as your heart beats. The second ("bottom") number is called the diastolic pressure. It is a measure of the pressure in your arteries when your heart relaxes between beats. Blood pressure is measured in a unit called mm Hg. Healthy blood pressure for most adults is 120/80. If your blood pressure is below 90/60, you may be diagnosed  with hypotension. Other information or tests that may be used to diagnose orthostatic hypotension include:  Your other vital signs, such as your heart rate and temperature.  Blood tests.  Tilt table test. For this test, you will be safely secured to a table that moves you from a lying position to an upright position. Your heart rhythm and blood pressure will be monitored during the test. How is this treated? This condition may be treated by:  Changing your diet. This may involve eating more salt (sodium) or drinking more water.  Taking medicines to raise your blood pressure.  Changing the dosage of certain medicines you are taking that might be lowering your blood pressure.  Wearing compression stockings. These stockings help to prevent blood clots and reduce swelling in your legs. In some cases, you may need to go  to the hospital for:  Fluid replacement. This means you will receive fluids through an IV.  Blood replacement. This means you will receive donated blood through an IV (transfusion).  Treating an infection or heart problems, if this applies.  Monitoring. You may need to be monitored while medicines that you are taking wear off. Follow these instructions at home: Eating and drinking  Drink enough fluid to keep your urine pale yellow.  Eat a healthy diet, and follow instructions from your health care provider about eating or drinking restrictions. A healthy diet includes: ? Fresh fruits and vegetables. ? Whole grains. ? Lean meats. ? Low-fat dairy products.  Eat extra salt only as directed. Do not add extra salt to your diet unless your health care provider told you to do that.  Eat frequent, small meals.  Avoid standing up suddenly after eating.   Medicines  Take over-the-counter and prescription medicines only as told by your health care provider. ? Follow instructions from your health care provider about changing the dosage of your current medicines, if this applies. ? Do not stop or adjust any of your medicines on your own. General instructions  Wear compression stockings as told by your health care provider.  Get up slowly from lying down or sitting positions. This gives your blood pressure a chance to adjust.  Avoid hot showers and excessive heat as directed by your health care provider.  Return to your normal activities as told by your health care provider. Ask your health care provider what activities are safe for you.  Do not use any products that contain nicotine or tobacco, such as cigarettes, e-cigarettes, and chewing tobacco. If you need help quitting, ask your health care provider.  Keep all follow-up visits as told by your health care provider. This is important.   Contact a health care provider if you:  Vomit.  Have diarrhea.  Have a fever for more than  2-3 days.  Feel more thirsty than usual.  Feel weak and tired. Get help right away if you:  Have chest pain.  Have a fast or irregular heartbeat.  Develop numbness in any part of your body.  Cannot move your arms or your legs.  Have trouble speaking.  Become sweaty or feel light-headed.  Faint.  Feel short of breath.  Have trouble staying awake.  Feel confused. Summary  Orthostatic hypotension is a sudden drop in blood pressure that happens when you quickly change positions.  Orthostatic hypotension is usually not a serious problem.  It is diagnosed by having your blood pressure taken lying down, sitting, and then standing.  It may be treated by changing your diet  or adjusting your medicines. This information is not intended to replace advice given to you by your health care provider. Make sure you discuss any questions you have with your health care provider. Document Revised: 10/07/2017 Document Reviewed: 10/07/2017 Elsevier Patient Education  2021 [employer].

## 2020-07-17 NOTE — Progress Notes (Signed)
Chief Complaint  Patient presents with   Dizziness   Annual  1. Dizziness x 2 weeks while driving to work she is anxious about driving her new car and felt like she was leaning to the side denies n/v but was spinning Monday BP was 150s sbp normally 136/64 lying 138/72 HR 71, sitting 122/62 HR 73, standing 108/70 HR 72 +orthostatic  On norvasc 5 mg and micardias 40 mg qd for BP  Vision recently checked with new glasses 2. Anxiety increased due to driving new car  Sister on zoloft 25 mg qd and helps her wants to try  Review of Systems  Constitutional: Negative for weight loss.  HENT: Negative for hearing loss.   Eyes: Negative for blurred vision.  Respiratory: Negative for shortness of breath.   Cardiovascular: Negative for chest pain.  Gastrointestinal: Negative for abdominal pain.  Musculoskeletal: Negative for falls and joint pain.  Skin: Negative for rash.  Neurological: Positive for dizziness.  Psychiatric/Behavioral: The patient is nervous/anxious.    Past Medical History:  Diagnosis Date   COVID-19    03/2020 loss of taste and smell   HTN (hypertension)    Past Surgical History:  Procedure Laterality Date   ABDOMINAL HYSTERECTOMY     ovaries intact had 2011/2012 due to fibroids/DUB   COLONOSCOPY WITH PROPOFOL N/A 08/25/2019   Procedure: COLONOSCOPY WITH PROPOFOL;  Surgeon: Wyline Mood, MD;  Location: [facility] ENDOSCOPY;  Service: Gastroenterology;  Laterality: N/A;  PRIORITY 4   lsh     Family History  Problem Relation Age of Onset   Breast cancer Mother 98   Dementia Mother    Diabetes Father    Hypertension Father    Diabetes Other        maternal family    Hypertension Sister    Social History   Socioeconomic History   Marital status: Married    Spouse name: Not on file   Number of children: Not on file   Years of education: Not on file   Highest education level: Not on file  Occupational History   Not on file  Tobacco Use   Smoking  status: Never Smoker   Smokeless tobacco: Never Used  Vaping Use   Vaping Use: Never used  Substance and Sexual Activity   Alcohol use: Never   Drug use: Never   Sexual activity: Yes  Other Topics Concern   Not on file  Social History Narrative   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   atorvastatin (LIPITOR) 20 MG tablet TAKE 1 TABLET BY MOUTH  DAILY AT 6PM   sertraline (ZOLOFT) 25 MG tablet Take 1 tablet (25 mg total) by mouth daily. In am  telmisartan (MICARDIS) 40 MG tablet Take 1 tablet (40 mg total) by mouth daily.   [DISCONTINUED] amLODipine (NORVASC) 5 MG tablet TAKE 1 TABLET BY MOUTH  DAILY   Allergies  Allergen Reactions   Sulfa Antibiotics     Hives    No results found for this or any previous visit (from the past 2160 hour(s)). Objective  Body mass index is 38.45 kg/m. Wt Readings from Last 3 Encounters:  07/17/20 238 lb 3.2 oz (108 kg)  08/25/19 240 lb 8 oz (109.1 kg)  07/18/19 248 lb 3.2 oz (112.6 kg)   Temp Readings from Last 3 Encounters:  07/17/20 98.2 F (36.8 C) (Oral)  08/25/19 (!) 97.5 F (36.4 C) (Temporal)  07/18/19 97.7 F (36.5 C) (Temporal)   BP Readings from Last 3 Encounters:  08/25/19 (!) 141/67  07/18/19 (!) 150/86  02/28/19 126/78   Pulse Readings from Last 3 Encounters:  08/25/19 87  07/18/19 80  02/28/19 83     Physical Exam Vitals and nursing note reviewed.  Constitutional:      Appearance: Normal appearance. She is well-developed and well-groomed. She is obese.  HENT:     Head: Normocephalic and atraumatic.  Eyes:     Conjunctiva/sclera: Conjunctivae normal.     Pupils: Pupils are equal, round, and reactive to light.  Cardiovascular:     Rate and Rhythm: Normal rate and regular rhythm.     Heart sounds: Normal heart sounds. No murmur heard.   Pulmonary:     Effort: Pulmonary effort is normal.     Breath sounds: Normal breath sounds.  Abdominal:     General: Abdomen is flat. Bowel sounds are normal.     Tenderness: There is no abdominal tenderness.  Skin:    General: Skin is warm and dry.  Neurological:     General: No focal deficit present.     Mental Status: She is alert and oriented to person, place, and time. Mental status is at baseline.     Gait: Gait normal.  Psychiatric:        Attention and Perception: Attention and perception normal.        Mood and Affect: Mood and affect normal.        Speech: Speech normal.        Behavior: Behavior normal. Behavior is cooperative.        Thought Content: Thought content normal.        Cognition and Memory: Cognition and memory normal.        Judgment: Judgment normal.     Assessment  Plan  Annual physical exam Flu shotutd  Tdaputd9/14/20  Consider shingrix in future  covid vx 2/2 not had booster had covid 03/2020 declines hep C/HIV   mammo9/28/2020 negativeordered again  mammo 03/27/20 negative ordered   Pap 10/04/17 negative Dr. Tiburcio Pea ob/gyn   Colonoscopy [facility] 08/25/19 hyperplastic polps f/u in 10 years   Skinleft back skin tag removed sent to [facility]  rec healthy diet and exercise  Essential hypertension - Plan: amLODipine (NORVASC) 2.5 MG tablet bid, micardis 40 mg qd monitor  BP, Comprehensive metabolic panel, Lipid panel, CBC with Differential/Platelet  Anxiety - Plan: sertraline (ZOLOFT)  25 MG tablet, TSH  Dizziness - Plan: TSH  +orthostasis today rec hydration 64 oz water, compression stockings changed BP meds  Monitor BP  If continues rec CT head  Prediabetes - Plan: Hemoglobin A1c  Vitamin D deficiency - Plan: Vitamin D (25 hydroxy)   Provider: Dr.  Olivia Mackie McLean-Scocuzza-[facility] Medicine

## 2020-07-19 ENCOUNTER — Other Ambulatory Visit: Payer: Self-pay | Admitting: Internal Medicine

## 2020-07-19 LAB — LIPID PANEL
Chol/HDL Ratio: 4 ratio (ref 0.0–4.4)
Cholesterol, Total: 192 mg/dL (ref 100–199)
HDL: 48 mg/dL (ref >39)
LDL Chol Calc (NIH): 129 mg/dL — ABNORMAL HIGH (ref 0–99)
Triglycerides: 80 mg/dL (ref 0–149)
VLDL Cholesterol Cal: 15 mg/dL (ref 5–40)

## 2020-07-19 LAB — CBC WITH DIFFERENTIAL/PLATELET
Basophils Absolute: 0 10*3/uL (ref 0.0–0.2)
Basos: 0 %
EOS (ABSOLUTE): 0.2 10*3/uL (ref 0.0–0.4)
Eos: 2 %
Hematocrit: 42.6 % (ref 34.0–46.6)
Hemoglobin: 14.1 g/dL (ref 11.1–15.9)
Immature Grans (Abs): 0 10*3/uL (ref 0.0–0.1)
Immature Granulocytes: 0 %
Lymphocytes Absolute: 4.4 10*3/uL — ABNORMAL HIGH (ref 0.7–3.1)
Lymphs: 44 %
MCH: 29.2 pg (ref 26.6–33.0)
MCHC: 33.1 g/dL (ref 31.5–35.7)
MCV: 88 fL (ref 79–97)
Monocytes Absolute: 0.9 10*3/uL (ref 0.1–0.9)
Monocytes: 8 %
Neutrophils Absolute: 4.6 10*3/uL (ref 1.4–7.0)
Neutrophils: 46 %
Platelets: 261 10*3/uL (ref 150–450)
RBC: 4.83 x10E6/uL (ref 3.77–5.28)
RDW: 13.6 % (ref 11.7–15.4)
WBC: 10.1 10*3/uL (ref 3.4–10.8)

## 2020-07-19 LAB — COMPREHENSIVE METABOLIC PANEL
ALT: 15 IU/L (ref 0–32)
AST: 18 IU/L (ref 0–40)
Albumin/Globulin Ratio: 1.4 (ref 1.2–2.2)
Albumin: 4.2 g/dL (ref 3.8–4.9)
Alkaline Phosphatase: 85 IU/L (ref 44–121)
BUN/Creatinine Ratio: 15 (ref 9–23)
BUN: 12 mg/dL (ref 6–24)
Bilirubin Total: 0.3 mg/dL (ref 0.0–1.2)
CO2: 21 mmol/L (ref 20–29)
Calcium: 9.5 mg/dL (ref 8.7–10.2)
Chloride: 101 mmol/L (ref 96–106)
Creatinine, Ser: 0.81 mg/dL (ref 0.57–1.00)
Globulin, Total: 2.9 g/dL (ref 1.5–4.5)
Glucose: 102 mg/dL — ABNORMAL HIGH (ref 65–99)
Potassium: 4.2 mmol/L (ref 3.5–5.2)
Sodium: 145 mmol/L — ABNORMAL HIGH (ref 134–144)
Total Protein: 7.1 g/dL (ref 6.0–8.5)
eGFR: 88 mL/min/{1.73_m2} (ref >59)

## 2020-07-19 LAB — URINALYSIS, ROUTINE W REFLEX MICROSCOPIC
Bilirubin, UA: NEGATIVE
Glucose, UA: NEGATIVE
Ketones, UA: NEGATIVE
Leukocytes,UA: NEGATIVE
Nitrite, UA: NEGATIVE
Protein,UA: NEGATIVE
RBC, UA: NEGATIVE
Specific Gravity, UA: 1.023 (ref 1.005–1.030)
Urobilinogen, Ur: 0.2 mg/dL (ref 0.2–1.0)
pH, UA: 6 (ref 5.0–7.5)

## 2020-07-19 LAB — HEMOGLOBIN A1C
Est. average glucose Bld gHb Est-mCnc: 120 mg/dL
Hgb A1c MFr Bld: 5.8 % — ABNORMAL HIGH (ref 4.8–5.6)

## 2020-07-19 LAB — VITAMIN D 25 HYDROXY (VIT D DEFICIENCY, FRACTURES): Vit D, 25-Hydroxy: 39.9 ng/mL (ref 30.0–100.0)

## 2020-07-19 LAB — TSH: TSH: 1.06 u[IU]/mL (ref 0.450–4.500)

## 2020-07-19 NOTE — Addendum Note (Signed)
Addended by: Quentin Ore on: 07/19/2020 04:27 PM   Modules accepted: Orders

## 2020-07-31 LAB — PATHOLOGIST SMEAR REVIEW
Basophils Absolute: 0.1 10*3/uL (ref 0.0–0.2)
Basos: 1 %
EOS (ABSOLUTE): 0.2 10*3/uL (ref 0.0–0.4)
Eos: 2 %
Hematocrit: 39.8 % (ref 34.0–46.6)
Hemoglobin: 13.5 g/dL (ref 11.1–15.9)
Immature Grans (Abs): 0 10*3/uL (ref 0.0–0.1)
Immature Granulocytes: 0 %
Lymphocytes Absolute: 4.8 10*3/uL — ABNORMAL HIGH (ref 0.7–3.1)
Lymphs: 45 %
MCH: 28.9 pg (ref 26.6–33.0)
MCHC: 33.9 g/dL (ref 31.5–35.7)
MCV: 85 fL (ref 79–97)
Monocytes Absolute: 0.7 10*3/uL (ref 0.1–0.9)
Monocytes: 6 %
Neutrophils Absolute: 5 10*3/uL (ref 1.4–7.0)
Neutrophils: 46 %
Platelets: 279 10*3/uL (ref 150–450)
RBC: 4.67 x10E6/uL (ref 3.77–5.28)
RDW: 13.1 % (ref 11.7–15.4)
WBC: 10.8 10*3/uL (ref 3.4–10.8)

## 2020-08-02 ENCOUNTER — Telehealth: Payer: Self-pay | Admitting: Internal Medicine

## 2020-08-02 NOTE — Telephone Encounter (Signed)
Patient informed and verbalized understanding

## 2020-08-02 NOTE — Telephone Encounter (Addendum)
Patient was returning call for lab results she said call on this number [phone]

## 2020-08-04 NOTE — Addendum Note (Signed)
Addended by: Quentin Ore on: 08/04/2020 09:51 PM   Modules accepted: Orders

## 2020-08-26 ENCOUNTER — Encounter: Payer: Self-pay | Admitting: Oncology

## 2020-08-26 ENCOUNTER — Inpatient Hospital Stay: Payer: 59 | Attending: Oncology | Admitting: Oncology

## 2020-08-26 ENCOUNTER — Inpatient Hospital Stay: Payer: 59

## 2020-08-26 VITALS — BP 136/100 | HR 75 | Temp 98.3°F | Resp 16 | Ht 64.0 in | Wt 237.0 lb

## 2020-08-26 DIAGNOSIS — Z833 Family history of diabetes mellitus: Secondary | ICD-10-CM | POA: Diagnosis not present

## 2020-08-26 DIAGNOSIS — D72820 Lymphocytosis (symptomatic): Secondary | ICD-10-CM | POA: Insufficient documentation

## 2020-08-26 DIAGNOSIS — G4733 Obstructive sleep apnea (adult) (pediatric): Secondary | ICD-10-CM | POA: Diagnosis not present

## 2020-08-26 DIAGNOSIS — Z8249 Family history of ischemic heart disease and other diseases of the circulatory system: Secondary | ICD-10-CM | POA: Insufficient documentation

## 2020-08-26 DIAGNOSIS — E559 Vitamin D deficiency, unspecified: Secondary | ICD-10-CM | POA: Diagnosis not present

## 2020-08-26 DIAGNOSIS — Z79899 Other long term (current) drug therapy: Secondary | ICD-10-CM | POA: Diagnosis not present

## 2020-08-26 DIAGNOSIS — R5383 Other fatigue: Secondary | ICD-10-CM | POA: Insufficient documentation

## 2020-08-26 DIAGNOSIS — I10 Essential (primary) hypertension: Secondary | ICD-10-CM | POA: Diagnosis not present

## 2020-08-26 DIAGNOSIS — Z803 Family history of malignant neoplasm of breast: Secondary | ICD-10-CM | POA: Insufficient documentation

## 2020-08-26 DIAGNOSIS — Z818 Family history of other mental and behavioral disorders: Secondary | ICD-10-CM | POA: Insufficient documentation

## 2020-08-26 DIAGNOSIS — Z882 Allergy status to sulfonamides status: Secondary | ICD-10-CM | POA: Insufficient documentation

## 2020-08-26 DIAGNOSIS — E785 Hyperlipidemia, unspecified: Secondary | ICD-10-CM | POA: Diagnosis not present

## 2020-08-26 LAB — CBC WITH DIFFERENTIAL/PLATELET
Abs Immature Granulocytes: 0.05 10*3/uL (ref 0.00–0.07)
Basophils Absolute: 0 10*3/uL (ref 0.0–0.1)
Basophils Relative: 0 %
Eosinophils Absolute: 0.2 10*3/uL (ref 0.0–0.5)
Eosinophils Relative: 2 %
HCT: 40.4 % (ref 36.0–46.0)
Hemoglobin: 13.5 g/dL (ref 12.0–15.0)
Immature Granulocytes: 1 %
Lymphocytes Relative: 37 %
Lymphs Abs: 3.7 10*3/uL (ref 0.7–4.0)
MCH: 29.2 pg (ref 26.0–34.0)
MCHC: 33.4 g/dL (ref 30.0–36.0)
MCV: 87.3 fL (ref 80.0–100.0)
Monocytes Absolute: 0.8 10*3/uL (ref 0.1–1.0)
Monocytes Relative: 8 %
Neutro Abs: 5.3 10*3/uL (ref 1.7–7.7)
Neutrophils Relative %: 52 %
Platelets: 254 10*3/uL (ref 150–400)
RBC: 4.63 MIL/uL (ref 3.87–5.11)
RDW: 13.8 % (ref 11.5–15.5)
WBC: 10.1 10*3/uL (ref 4.0–10.5)
nRBC: 0 % (ref 0.0–0.2)

## 2020-08-26 NOTE — Progress Notes (Signed)
New pt. And does not have any problems with wbc up in her family. She is anxious from being here

## 2020-08-26 NOTE — Progress Notes (Signed)
Hematology/Oncology Consult note [facility] Telephone:([phone] [phone]  Patient Care Team: McLean-Scocuzza, Pasty Spillers, MD as PCP - General (Internal Medicine)   Name of the patient: Donna Hinton  [id number]  April 18, 1971    Reason for referral-lymphocytosis Referring physician-Dr. Mclean-Scocuzza  Date of visit: 08/26/20   History of presenting illness- Patient is a 50 year old female with a past medical history significant for hypertension hyperlipidemia who has been referred to [place] for lymphocytosis.  Most recent CBC from 07/18/2020 showed white count of 10.1, H&H of 14.1/42.6 with a platelet count of 261.  Absolute lymphocyte count was mildly elevated at 4.4.  In the past in August 2020 as well as July 2021 she was noted to have a mild absolute lymphocytosis with a lymphocyte count of 3.9 in the absence of anemia or thrombocytopenia.  Patient reports that her appetite and weight have remained overall stable.  She does report some mild fatigue.  Denies any drenching night sweats.  ECOG PS- 0  Pain scale- 0   Review of systems- Review of Systems  Constitutional: Positive for malaise/fatigue. Negative for chills, fever and weight loss.  HENT: Negative for congestion, ear discharge and nosebleeds.   Eyes: Negative for blurred vision.  Respiratory: Negative for cough, hemoptysis, sputum production, shortness of breath and wheezing.   Cardiovascular: Negative for chest pain, palpitations, orthopnea and claudication.  Gastrointestinal: Negative for abdominal pain, blood in stool, constipation, diarrhea, heartburn, melena, nausea and vomiting.  Genitourinary: Negative for dysuria, flank pain, frequency, hematuria and urgency.  Musculoskeletal: Negative for back pain, joint pain and myalgias.  Skin: Negative for rash.  Neurological: Negative for dizziness, tingling, focal weakness, seizures, weakness and headaches.  Endo/Heme/Allergies: Does not  bruise/bleed easily.  Psychiatric/Behavioral: Negative for depression and suicidal ideas. The patient does not have insomnia.     Allergies  Allergen Reactions  . Sulfa Antibiotics     Hives     Patient Active Problem List   Diagnosis Date Noted  . OSA on CPAP 02/14/2019  . Skin tag 02/14/2019  . Annual physical exam 02/14/2019  . HLD (hyperlipidemia) 12/26/2018  . Vitamin D deficiency 12/20/2018  . HTN (hypertension)   . Obesity, Class III, BMI 40-49.9 (morbid obesity) (HCC) 10/04/2017     Past Medical History:  Diagnosis Date  . COVID-19    03/2020 loss of taste and smell  . HTN (hypertension)      Past Surgical History:  Procedure Laterality Date  . ABDOMINAL HYSTERECTOMY     ovaries intact had 2011/2012 due to fibroids/DUB  . COLONOSCOPY WITH PROPOFOL N/A 08/25/2019   Procedure: COLONOSCOPY WITH PROPOFOL;  Surgeon: Wyline Mood, MD;  Location: [facility] ENDOSCOPY;  Service: Gastroenterology;  Laterality: N/A;  PRIORITY 4  . lsh      Social History   Socioeconomic History  . Marital status: Married    Spouse name: Not on file  . Number of children: Not on file  . Years of education: Not on file  . Highest education level: Not on file  Occupational History  . Not on file  Tobacco Use  . Smoking status: Never Smoker  . Smokeless tobacco: Never Used  Vaping Use  . Vaping Use: Never used  Substance and Sexual Activity  . Alcohol use: Never  . Drug use: Never  . Sexual activity: Yes  Other Topics Concern  . 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 Breast cancer Mother 10  . Dementia Mother   . Diabetes Father   . Hypertension Father   . Hypertension Sister   . Diabetes Cousin      Current Outpatient Medications:  .  amLODipine (NORVASC) 2.5 MG tablet, Take 1 tablet (2.5 mg total) by mouth in the morning and at bedtime., Disp: 180 tablet, Rfl: 3 .  atorvastatin (LIPITOR) 20 MG tablet, TAKE 1 TABLET BY MOUTH  DAILY AT 6PM, Disp: 90 tablet, Rfl: 3 .  sertraline (ZOLOFT) 25 MG tablet, Take 1 tablet (25 mg total) by mouth daily. In am, Disp: 90 tablet, Rfl: 3 .  telmisartan (MICARDIS) 40 MG tablet, Take 1 tablet (40 mg total) by mouth daily., Disp: 90 tablet, Rfl: 3   Physical exam:  Vitals:   08/26/20 1117  BP: (!) 136/100  Pulse: 75  Resp: 16  Temp: 98.3 F (36.8 C)  TempSrc: Oral  Weight: 237 lb (107.5 kg)  Height: 5\' 4"  (1.626 m)   Physical Exam Constitutional:      General: She is not in acute distress. Cardiovascular:     Rate and Rhythm: Normal rate and regular rhythm.     Heart sounds: Normal heart sounds.  Pulmonary:     Effort: Pulmonary effort is normal.     Breath sounds: Normal breath sounds.  Abdominal:     General: Bowel sounds are normal.     Palpations: Abdomen is soft.     Comments: No palpable hepatosplenomegaly  Lymphadenopathy:     Comments: No palpable cervical, supraclavicular, axillary or inguinal adenopathy   Skin:    General: Skin is warm and dry.  Neurological:     Mental Status: She is alert and oriented to person, place, and time.        CMP Latest Ref Rng & Units 07/18/2020  Glucose 65 - 99 mg/dL 07/20/2020)  BUN 6 - 24 mg/dL 12  Creatinine 242(P - 5.36 mg/dL 1.44  Sodium 3.15 - 400 mmol/L 145(H)  Potassium 3.5 - 5.2 mmol/L 4.2  Chloride 96 - 106 mmol/L 101  CO2 20 - 29 mmol/L 21  Calcium 8.7 - 10.2 mg/dL 9.5  Total Protein 6.0 -  8.5 g/dL 7.1  Total Bilirubin 0.0 - 1.2 mg/dL 0.3  Alkaline Phos 44 - 121 IU/L 85  AST 0 - 40 IU/L 18  ALT 0 - 32 IU/L 15   CBC Latest Ref Rng & Units 08/26/2020  WBC 4.0 - 10.5 K/uL 10.1  Hemoglobin 12.0 - 15.0 g/dL 10/26/2020  Hematocrit 61.9 - 46.0 % 40.4  Platelets 150 - 400 K/uL 254    Assessment and plan- Patient is a 50 y.o. female referred for lymphocytosis  Patient has had mild lymphocytosis dating back to August 2020 with a normal white cell count in the absence of anemia or thrombocytopenia  or B symptoms.  We could be dealing with monoclonal B-cell lymphocytosis when absolute lymphocyte count is less than 5000 and falls in the spectrum of CLL.  We will check CBC with differential and flow cytometry today and I will see her back in 2 weeks time for a video visit to discuss the results of blood work and further management   Thank you for this kind referral and the opportunity to participate in the care of this patient   Visit Diagnosis 1. Lymphocytosis     Dr. Owens Shark, MD, MPH [facility] at [facility] [phone] 08/26/2020 9:05 PM

## 2020-08-28 LAB — COMP PANEL: LEUKEMIA/LYMPHOMA

## 2020-09-11 ENCOUNTER — Inpatient Hospital Stay: Payer: 59 | Attending: Oncology | Admitting: Oncology

## 2020-09-11 DIAGNOSIS — D72820 Lymphocytosis (symptomatic): Secondary | ICD-10-CM

## 2020-09-11 NOTE — Progress Notes (Signed)
I connected with Donna Hinton on 09/11/20 at 11:15 AM EDT by video enabled telemedicine visit and verified that I am speaking with the correct person using two identifiers.   I discussed the limitations, risks, security and privacy concerns of performing an evaluation and management service by telemedicine and the availability of in-person appointments. I also discussed with the patient that there may be a patient responsible charge related to this service. The patient expressed understanding and agreed to proceed.  Other persons participating in the visit and their role in the encounter:  none  Patient's location:  home Provider's location:  work  Stage manager Complaint: Discuss results of blood work  Diagnosis: Lymphocytosis likely reactive  History of present illness: Patient is a 50 year old female with a past medical history significant for hypertension hyperlipidemia who has been referred to [place] for lymphocytosis.  Most recent CBC from 07/18/2020 showed white count of 10.1, H&H of 14.1/42.6 with a platelet count of 261.  Absolute lymphocyte count was mildly elevated at 4.4.  In the past in August 2020 as well as July 2021 she was noted to have a mild absolute lymphocytosis with a lymphocyte count of 3.9 in the absence of anemia or thrombocytopenia.  Patient reports that her appetite and weight have remained overall stable.  She does report some mild fatigue.  Denies any drenching night sweats.  CBC from 08/26/2020 showed a normal white count of 10.1 with a normal differential and an absolute lymphocyte count of 3.7.  Smear review was unremarkable and Flow cytometry did not show any immunophenotypic abnormality  Interval history: Patient reports doing well and denies any specific complaints at this time    Review of Systems  Constitutional: Negative for chills, fever, malaise/fatigue and weight loss.  HENT: Negative for congestion, ear discharge and nosebleeds.   Eyes: Negative for blurred vision.   Respiratory: Negative for cough, hemoptysis, sputum production, shortness of breath and wheezing.   Cardiovascular: Negative for chest pain, palpitations, orthopnea and claudication.  Gastrointestinal: Negative for abdominal pain, blood in stool, constipation, diarrhea, heartburn, melena, nausea and vomiting.  Genitourinary: Negative for dysuria, flank pain, frequency, hematuria and urgency.  Musculoskeletal: Negative for back pain, joint pain and myalgias.  Skin: Negative for rash.  Neurological: Negative for dizziness, tingling, focal weakness, seizures, weakness and headaches.  Endo/Heme/Allergies: Does not bruise/bleed easily.  Psychiatric/Behavioral: Negative for depression and suicidal ideas. The patient does not have insomnia.     Allergies  Allergen Reactions  . Sulfa Antibiotics     Hives     Past Medical History:  Diagnosis Date  . COVID-19    03/2020 loss of taste and smell  . HTN (hypertension)     Past Surgical History:  Procedure Laterality Date  . ABDOMINAL HYSTERECTOMY     ovaries intact had 2011/2012 due to fibroids/DUB  . COLONOSCOPY WITH PROPOFOL N/A 08/25/2019   Procedure: COLONOSCOPY WITH PROPOFOL;  Surgeon: Wyline Mood, MD;  Location: [facility] ENDOSCOPY;  Service: Gastroenterology;  Laterality: N/A;  PRIORITY 4  . lsh      Social History   Socioeconomic History  . Marital status: Married    Spouse name: Not on file  . Number of children: Not on file  . Years of education: Not on file  . Highest education level: Not on file  Occupational History  . Not on file  Tobacco Use  . Smoking status: Never Smoker  . Smokeless tobacco: Never Used  Vaping Use  . Vaping Use: Never used  Substance  and Sexual Activity  . Alcohol use: Never  . Drug use: Never  . Sexual activity: Yes  Other Topics Concern  . 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 Breast cancer Mother 63  . Dementia Mother   . Diabetes Father   . Hypertension Father   . Hypertension Sister   . Diabetes Cousin      Current Outpatient Medications:  .  amLODipine (NORVASC) 2.5 MG tablet, Take 1 tablet (2.5 mg total) by mouth in the morning and at bedtime., Disp: 180 tablet, Rfl: 3 .  atorvastatin (LIPITOR) 20 MG tablet, TAKE 1 TABLET BY MOUTH  DAILY AT 6PM, Disp: 90 tablet, Rfl: 3 .  sertraline (ZOLOFT) 25 MG tablet, Take 1 tablet (25 mg total) by mouth daily. In am, Disp: 90 tablet, Rfl: 3 .  telmisartan (MICARDIS) 40 MG tablet, Take 1 tablet (40 mg total) by mouth daily., Disp: 90 tablet, Rfl: 3  No results found.  No images are attached to the encounter.   CMP Latest Ref Rng & Units 07/18/2020  Glucose 65 - 99 mg/dL 948(N)  BUN 6 - 24 mg/dL 12  Creatinine 4.62 - 7.03 mg/dL 5.00  Sodium 938 - 182 mmol/L 145(H)  Potassium 3.5 - 5.2 mmol/L 4.2  Chloride 96 - 106 mmol/L 101  CO2 20 - 29 mmol/L 21  Calcium 8.7 - 10.2 mg/dL 9.5  Total Protein 6.0 - 8.5 g/dL 7.1  Total Bilirubin 0.0 - 1.2 mg/dL 0.3  Alkaline Phos 44 - 121 IU/L 85  AST 0 - 40 IU/L 18  ALT 0 - 32 IU/L 15   CBC Latest Ref Rng & Units 08/26/2020  WBC 4.0 - 10.5 K/uL 10.1  Hemoglobin 12.0 - 15.0 g/dL 99.3  Hematocrit 71.6 - 46.0 % 40.4  Platelets 150 - 400 K/uL 254     Observation/objective : Appears in no acute distress over video visit today.  Breathing is nonlabored   Assessment and plan: Patient is a 50 year old female referred for  lymphocytosis likely reactive  Discussed with the patient that results of flow cytometry did not show any clonal lymphocytosis.  Her CBC done in our clinic was normal with a normal white count and normal differential.  Given that there is no clonal process noted on the flow cytometry she does not require follow-up with [place] at this time  Follow-up instructions: No follow-up needed  I discussed the assessment and treatment plan with the patient. The patient was provided an opportunity to ask questions and all were answered. The patient agreed with the plan and demonstrated an understanding of the instructions.   The patient was advised to call back or seek an in-person evaluation if the symptoms worsen or if the condition fails  to improve as anticipated.    Visit Diagnosis: 1. Lymphocytosis     Dr. Owens Shark, MD, MPH [facility] at [facility] Tel- [phone] 09/11/2020 4:56 PM

## 2020-09-17 ENCOUNTER — Other Ambulatory Visit: Payer: Self-pay | Admitting: Internal Medicine

## 2020-09-17 DIAGNOSIS — I10 Essential (primary) hypertension: Secondary | ICD-10-CM

## 2020-11-19 ENCOUNTER — Other Ambulatory Visit: Payer: Self-pay

## 2020-11-19 ENCOUNTER — Ambulatory Visit (INDEPENDENT_AMBULATORY_CARE_PROVIDER_SITE_OTHER): Payer: 59 | Admitting: Internal Medicine

## 2020-11-19 ENCOUNTER — Encounter: Payer: Self-pay | Admitting: Internal Medicine

## 2020-11-19 VITALS — BP 116/70 | HR 74 | Temp 98.1°F | Ht 64.0 in | Wt 241.0 lb

## 2020-11-19 DIAGNOSIS — Z1231 Encounter for screening mammogram for malignant neoplasm of breast: Secondary | ICD-10-CM

## 2020-11-19 DIAGNOSIS — R7303 Prediabetes: Secondary | ICD-10-CM

## 2020-11-19 DIAGNOSIS — I10 Essential (primary) hypertension: Secondary | ICD-10-CM | POA: Diagnosis not present

## 2020-11-19 NOTE — Progress Notes (Addendum)
Chief Complaint  Patient presents with   Follow-up   Fu 1. Htn controlled on norvasc 2.5 mg bid and micardis 40 mg qd 2. Hld on lipitor 20 mg qd  3. Had covid 19 03/2020 loss of taste and smell improved    Review of Systems  Constitutional:  Negative for weight loss.  HENT:  Negative for hearing loss.   Eyes:  Negative for blurred vision.  Respiratory:  Negative for shortness of breath.   Cardiovascular:  Negative for chest pain.  Gastrointestinal:  Negative for abdominal pain.  Musculoskeletal:  Negative for falls and joint pain.  Skin:  Negative for rash.  Neurological:  Negative for headaches.  Psychiatric/Behavioral:  Negative for depression.   Past Medical History:  Diagnosis Date   COVID-19    03/2020 loss of taste and smell   Heart murmur    HTN (hypertension)    Past Surgical History:  Procedure Laterality Date   ABDOMINAL HYSTERECTOMY     ovaries intact had 2011/2012 due to fibroids/DUB   COLONOSCOPY WITH PROPOFOL N/A 08/25/2019   Procedure: COLONOSCOPY WITH PROPOFOL;  Surgeon: Wyline Mood, MD;  Location[facility] ENDOSCOPY;  Service: Gastroenterology;  Laterality: N/A;  PRIORITY 4   lsh     Family History  Problem Relation Age of Onset   Breast cancer Mother 82   Dementia Mother    Diabetes Father    Hypertension Father    Hypertension Sister    Diabetes Cousin    Social History   Socioeconomic History   Marital status: Married    Spouse name: Not on file   Number of children: Not on file   Years of education: Not on file   Highest education level: Not on file  Occupational History   Not on file  Tobacco Use   Smoking status: Never   Smokeless tobacco: Never  Vaping Use   Vaping Use: Never used  Substance and Sexual Activity   Alcohol use: Never   Drug use: Never   Sexual activity: Yes  Other Topics Concern   Not on file  Social History Narrative   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   [DISCONTINUED] amLODipine (NORVASC) 2.5 MG tablet Take 1 tablet (2.5 mg total) by mouth in the morning and at bedtime.   [DISCONTINUED] atorvastatin (LIPITOR) 20 MG tablet TAKE 1 TABLET BY MOUTH  DAILY AT 6PM   [DISCONTINUED] sertraline (ZOLOFT) 25 MG tablet Take 1 tablet (25 mg total) by mouth daily. In am   [DISCONTINUED] telmisartan (MICARDIS) 40 MG tablet TAKE 1 TABLET BY MOUTH  DAILY   Allergies  Allergen Reactions   Sulfa Antibiotics     Hives    No results found for this  or any previous visit (from the past 2160 hour(s)).  Objective  Body mass index is 41.37 kg/m. Wt Readings from Last 3 Encounters:  05/27/21 257 lb 6.4 oz (116.8 kg)  11/19/20 241 lb (109.3 kg)  08/26/20 237 lb (107.5 kg)   Temp Readings from Last 3 Encounters:  05/27/21 98.4 F (36.9 C) (Oral)  11/19/20 98.1 F (36.7 C) (Oral)  08/26/20 98.3 F (36.8 C) (Oral)   BP Readings from Last 3 Encounters:  05/27/21 110/64  11/19/20 116/70  08/26/20 (!) 136/100   Pulse Readings from Last 3 Encounters:  05/27/21 80  11/19/20 74  08/26/20 75    Physical Exam Vitals and nursing note reviewed.  Constitutional:      Appearance: Normal appearance. She is well-developed and well-groomed. She is morbidly obese.  HENT:     Head: Normocephalic and atraumatic.  Eyes:     Conjunctiva/sclera: Conjunctivae normal.     Pupils: Pupils are equal, round, and reactive to light.   Cardiovascular:     Rate and Rhythm: Normal rate and regular rhythm.     Heart sounds: Normal heart sounds. No murmur heard. Pulmonary:     Effort: Pulmonary effort is normal.     Breath sounds: Normal breath sounds.  Skin:    General: Skin is warm and dry.  Neurological:     General: No focal deficit present.     Mental Status: She is alert and oriented to person, place, and time. Mental status is at baseline.     Gait: Gait normal.  Psychiatric:        Attention and Perception: Attention and perception normal.        Mood and Affect: Mood and affect normal.        Speech: Speech normal.        Behavior: Behavior normal. Behavior is cooperative.        Thought Content: Thought content normal.        Cognition and Memory: Cognition and memory normal.        Judgment: Judgment normal.    Assessment  Plan  Prediabetes - Plan: Hemoglobin A1c  Hypertension, unspecified type - Plan: Comprehensive metabolic panel, Lipid panel, CBC w/Diff Cont meds norvasc 2.5 and micardis 40 mg qd   Screening mammogram, encounter for   HM Flu shot utd  Tdap utd 01/09/19 Consider shingrix in future  covid vx 2/2 not had booster had covid 03/2020  declines hep C/HIV    mammo 01/23/2019 negative ordered again  mammo 03/27/20 negative ordered    Pap 10/04/17 negative Dr. Tiburcio Pea ob/gyn s/p hysterectomy  11/22/20 This message is for you information, as your primary care physician alerted me to the question of PAP and need for gyn care follow ups.  As far as PAP is concerned, I perform one every five years after a hysterectomy, to ward off developments of vaginal cancer.  Although this is a rare cancer, occasional checks are a good idea.  As far as gyn office visits, many will follow up yearly to monitor breast and pelvic exam, and assess bladder function or dysfunction, and to protect against other post-menopausal symptoms or concerns.  I am available to see you as desired.  Dr Tiburcio Pea Colonoscopy  [facility] 08/25/19 hyperplastic polps f/u in 10 years    Skin left back skin tag removed sent to [facility]   rec healthy diet and exercise  Provider: Dr. French Ana McLean-Scocuzza-Internal Medicine

## 2020-11-19 NOTE — Patient Instructions (Addendum)
Consider 3rd pfizer but 4 are recommended   Zoster Vaccine, Recombinant injection X 2 doses  What is this medication? ZOSTER VACCINE (ZOS ter vak SEEN) is a vaccine used to reduce the risk of getting shingles. This vaccine is not used to treat shingles or nerve pain fromshingles. This medicine may be used for other purposes; ask your health care provider orpharmacist if you have questions. COMMON BRAND NAME(S): [facility] What should I tell my care team before I take this medication? They need to know if you have any of these conditions: cancer immune system problems an unusual or allergic reaction to Zoster vaccine, other medications, foods, dyes, or preservatives pregnant or trying to get pregnant breast-feeding How should I use this medication? This vaccine is injected into a muscle. It is given by a health care provider. A copy of Vaccine Information Statements will be given before each vaccination. Be sure to read this information carefully each time. This sheet may changeoften. Talk to your health care provider about the use of this vaccine in children.This vaccine is not approved for use in children. Overdosage: If you think you have taken too much of this medicine contact apoison control center or emergency room at once. NOTE: This medicine is only for you. Do not share this medicine with others. What if I miss a dose? Keep appointments for follow-up (booster) doses. It is important not to miss your dose. Call your health care provider if you are unable to keep anappointment. What may interact with this medication? medicines that suppress your immune system medicines to treat cancer steroid medicines like prednisone or cortisone This list may not describe all possible interactions. Give your health care provider a list of all the medicines, herbs, non-prescription drugs, or dietary supplements you use. Also tell them if you smoke, drink alcohol, or use illegaldrugs. Some items may  interact with your medicine. What should I watch for while using this medication? Visit your health care provider regularly. This vaccine, like all vaccines, may not fully protect everyone. What side effects may I notice from receiving this medication? Side effects that you should report to your doctor or health care professionalas soon as possible: allergic reactions (skin rash, itching or hives; swelling of the face, lips, or tongue) trouble breathing Side effects that usually do not require medical attention (report these toyour doctor or health care professional if they continue or are bothersome): chills headache fever nausea pain, redness, or irritation at site where injected tiredness vomiting This list may not describe all possible side effects. Call your doctor for medical advice about side effects. You may report side effects to FDA at1-800-FDA-1088. Where should I keep my medication? This vaccine is only given by a health care provider. It will not be stored athome. NOTE: This sheet is a summary. It may not cover all possible information. If you have questions about this medicine, talk to your doctor, pharmacist, orhealth care provider.  2022 Elsevier/Gold Standard (2019-05-19 16:23:07)

## 2020-11-27 LAB — COMPREHENSIVE METABOLIC PANEL
ALT: 10 IU/L (ref 0–32)
AST: 13 IU/L (ref 0–40)
Albumin/Globulin Ratio: 1.6 (ref 1.2–2.2)
Albumin: 4.1 g/dL (ref 3.8–4.9)
Alkaline Phosphatase: 82 IU/L (ref 44–121)
BUN/Creatinine Ratio: 14 (ref 9–23)
BUN: 11 mg/dL (ref 6–24)
Bilirubin Total: 0.4 mg/dL (ref 0.0–1.2)
CO2: 22 mmol/L (ref 20–29)
Calcium: 9.2 mg/dL (ref 8.7–10.2)
Chloride: 105 mmol/L (ref 96–106)
Creatinine, Ser: 0.8 mg/dL (ref 0.57–1.00)
Globulin, Total: 2.5 g/dL (ref 1.5–4.5)
Glucose: 93 mg/dL (ref 65–99)
Potassium: 4.3 mmol/L (ref 3.5–5.2)
Sodium: 141 mmol/L (ref 134–144)
Total Protein: 6.6 g/dL (ref 6.0–8.5)
eGFR: 89 mL/min/{1.73_m2} (ref >59)

## 2020-11-27 LAB — LIPID PANEL
Chol/HDL Ratio: 3.7 ratio (ref 0.0–4.4)
Cholesterol, Total: 187 mg/dL (ref 100–199)
HDL: 50 mg/dL (ref >39)
LDL Chol Calc (NIH): 124 mg/dL — ABNORMAL HIGH (ref 0–99)
Triglycerides: 72 mg/dL (ref 0–149)
VLDL Cholesterol Cal: 13 mg/dL (ref 5–40)

## 2020-11-27 LAB — CBC WITH DIFFERENTIAL/PLATELET
Basophils Absolute: 0 10*3/uL (ref 0.0–0.2)
Basos: 0 %
EOS (ABSOLUTE): 0.2 10*3/uL (ref 0.0–0.4)
Eos: 2 %
Hematocrit: 39.3 % (ref 34.0–46.6)
Hemoglobin: 13.2 g/dL (ref 11.1–15.9)
Immature Grans (Abs): 0 10*3/uL (ref 0.0–0.1)
Immature Granulocytes: 0 %
Lymphocytes Absolute: 4.5 10*3/uL — ABNORMAL HIGH (ref 0.7–3.1)
Lymphs: 46 %
MCH: 28.8 pg (ref 26.6–33.0)
MCHC: 33.6 g/dL (ref 31.5–35.7)
MCV: 86 fL (ref 79–97)
Monocytes Absolute: 0.8 10*3/uL (ref 0.1–0.9)
Monocytes: 8 %
Neutrophils Absolute: 4.3 10*3/uL (ref 1.4–7.0)
Neutrophils: 44 %
Platelets: 272 10*3/uL (ref 150–450)
RBC: 4.59 x10E6/uL (ref 3.77–5.28)
RDW: 13 % (ref 11.7–15.4)
WBC: 9.8 10*3/uL (ref 3.4–10.8)

## 2020-11-27 LAB — HEMOGLOBIN A1C
Est. average glucose Bld gHb Est-mCnc: 117 mg/dL
Hgb A1c MFr Bld: 5.7 % — ABNORMAL HIGH (ref 4.8–5.6)

## 2020-12-02 ENCOUNTER — Other Ambulatory Visit: Payer: Self-pay | Admitting: Internal Medicine

## 2020-12-02 DIAGNOSIS — E785 Hyperlipidemia, unspecified: Secondary | ICD-10-CM

## 2020-12-02 MED ORDER — ATORVASTATIN CALCIUM 40 MG PO TABS: 40.0000 mg | ORAL_TABLET | Freq: Every day | ORAL | 3 refills | Status: DC

## 2020-12-02 NOTE — Progress Notes (Signed)
Hello French Ana, I wouldn't worry about it. Cbc is otherwise normal and lymphocytosis intermittent. Flow cytometry showed no abnormality.

## 2021-05-05 ENCOUNTER — Other Ambulatory Visit: Payer: Self-pay | Admitting: Internal Medicine

## 2021-05-05 DIAGNOSIS — Z1231 Encounter for screening mammogram for malignant neoplasm of breast: Secondary | ICD-10-CM

## 2021-05-19 ENCOUNTER — Other Ambulatory Visit: Payer: Self-pay | Admitting: Internal Medicine

## 2021-05-19 DIAGNOSIS — I10 Essential (primary) hypertension: Secondary | ICD-10-CM

## 2021-05-19 DIAGNOSIS — F419 Anxiety disorder, unspecified: Secondary | ICD-10-CM

## 2021-05-27 ENCOUNTER — Ambulatory Visit (INDEPENDENT_AMBULATORY_CARE_PROVIDER_SITE_OTHER): Payer: 59 | Admitting: Internal Medicine

## 2021-05-27 ENCOUNTER — Encounter: Payer: Self-pay | Admitting: Internal Medicine

## 2021-05-27 ENCOUNTER — Other Ambulatory Visit: Payer: Self-pay

## 2021-05-27 VITALS — BP 110/64 | HR 80 | Temp 98.4°F | Ht 64.0 in | Wt 257.4 lb

## 2021-05-27 DIAGNOSIS — Z23 Encounter for immunization: Secondary | ICD-10-CM

## 2021-05-27 DIAGNOSIS — E785 Hyperlipidemia, unspecified: Secondary | ICD-10-CM

## 2021-05-27 DIAGNOSIS — E559 Vitamin D deficiency, unspecified: Secondary | ICD-10-CM

## 2021-05-27 DIAGNOSIS — Z1329 Encounter for screening for other suspected endocrine disorder: Secondary | ICD-10-CM

## 2021-05-27 DIAGNOSIS — Z0184 Encounter for antibody response examination: Secondary | ICD-10-CM

## 2021-05-27 DIAGNOSIS — R7303 Prediabetes: Secondary | ICD-10-CM

## 2021-05-27 DIAGNOSIS — I10 Essential (primary) hypertension: Secondary | ICD-10-CM | POA: Diagnosis not present

## 2021-05-27 DIAGNOSIS — R011 Cardiac murmur, unspecified: Secondary | ICD-10-CM

## 2021-05-27 MED ORDER — ATORVASTATIN CALCIUM 40 MG PO TABS: 40.0000 mg | ORAL_TABLET | Freq: Every day | ORAL | 3 refills | Status: DC

## 2021-05-27 MED ORDER — TELMISARTAN 40 MG PO TABS: 40.0000 mg | ORAL_TABLET | Freq: Every day | ORAL | 3 refills | Status: DC

## 2021-05-27 MED ORDER — SHINGRIX 50 MCG/0.5ML IM SUSR: 0.5000 mL | Freq: Once | INTRAMUSCULAR | 0 refills | Status: DC

## 2021-05-27 NOTE — Patient Instructions (Signed)
Zoster Vaccine, Recombinant injection °What is this medication? °ZOSTER VACCINE (ZOS ter vak SEEN) is a vaccine used to reduce the risk of getting shingles. This vaccine is not used to treat shingles or nerve pain from shingles. °This medicine may be used for other purposes; ask your health care provider or pharmacist if you have questions. °COMMON BRAND NAME(S): SHINGRIX °What should I tell my care team before I take this medication? °They need to know if you have any of these conditions: °cancer °immune system problems °an unusual or allergic reaction to Zoster vaccine, other medications, foods, dyes, or preservatives °pregnant or trying to get pregnant °breast-feeding °How should I use this medication? °This vaccine is injected into a muscle. It is given by a health care provider. °A copy of Vaccine Information Statements will be given before each vaccination. Be sure to read this information carefully each time. This sheet may change often. °Talk to your health care provider about the use of this vaccine in children. This vaccine is not approved for use in children. °Overdosage: If you think you have taken too much of this medicine contact a poison control center or emergency room at once. °NOTE: This medicine is only for you. Do not share this medicine with others. °What if I miss a dose? °Keep appointments for follow-up (booster) doses. It is important not to miss your dose. Call your health care provider if you are unable to keep an appointment. °What may interact with this medication? °medicines that suppress your immune system °medicines to treat cancer °steroid medicines like prednisone or cortisone °This list may not describe all possible interactions. Give your health care provider a list of all the medicines, herbs, non-prescription drugs, or dietary supplements you use. Also tell them if you smoke, drink alcohol, or use illegal drugs. Some items may interact with your medicine. °What should I watch for  while using this medication? °Visit your health care provider regularly. °This vaccine, like all vaccines, may not fully protect everyone. °What side effects may I notice from receiving this medication? °Side effects that you should report to your doctor or health care professional as soon as possible: °allergic reactions (skin rash, itching or hives; swelling of the face, lips, or tongue) °trouble breathing °Side effects that usually do not require medical attention (report these to your doctor or health care professional if they continue or are bothersome): °chills °headache °fever °nausea °pain, redness, or irritation at site where injected °tiredness °vomiting °This list may not describe all possible side effects. Call your doctor for medical advice about side effects. You may report side effects to FDA at 1-800-FDA-1088. °Where should I keep my medication? °This vaccine is only given by a health care provider. It will not be stored at home. °NOTE: This sheet is a summary. It may not cover all possible information. If you have questions about this medicine, talk to your doctor, pharmacist, or health care provider. °© 2022 Elsevier/Gold Standard (2020-12-31 00:00:00) ° °

## 2021-05-27 NOTE — Progress Notes (Signed)
Chief Complaint  Patient presents with   Follow-up   F/u  1. Htn controlled on norvasc 2.5 mg qd micardis 40 mg qd hld on lipitor 40 mg qhs  2. Vit d def will recheck  No issues today  3. Prediabetes rec healthy diet and exercise   Review of Systems  Constitutional:  Negative for weight loss.  HENT:  Negative for hearing loss.   Eyes:  Negative for blurred vision.  Respiratory:  Negative for shortness of breath.   Cardiovascular:  Negative for chest pain.  Gastrointestinal:  Negative for abdominal pain and blood in stool.  Genitourinary:  Negative for dysuria.  Musculoskeletal:  Negative for falls and joint pain.  Skin:  Negative for rash.  Neurological:  Negative for headaches.  Psychiatric/Behavioral:  Negative for depression.   Past Medical History:  Diagnosis Date   COVID-19    03/2020 loss of taste and smell   HTN (hypertension)    Past Surgical History:  Procedure Laterality Date   ABDOMINAL HYSTERECTOMY     ovaries intact had 2011/2012 due to fibroids/DUB   COLONOSCOPY WITH PROPOFOL N/A 08/25/2019   Procedure: COLONOSCOPY WITH PROPOFOL;  Surgeon: Jonathon Bellows, MD;  Location: [facility] ENDOSCOPY;  Service: Gastroenterology;  Laterality: N/A;  PRIORITY 4   lsh     Family History  Problem Relation Age of Onset   Breast cancer Mother 50   Dementia Mother    Diabetes Father    Hypertension Father    Hypertension Sister    Diabetes Cousin    Social History   Socioeconomic History   Marital status: Married    Spouse name: Not on file   Number of children: Not on file   Years of education: Not on file   Highest education level: Not on file  Occupational History   Not on file  Tobacco Use   Smoking status: Never   Smokeless tobacco: Never  Vaping Use   Vaping Use: Never used  Substance and Sexual Activity   Alcohol use: Never   Drug use: Never   Sexual activity: Yes  Other Topics Concern   Not on file  Social History Narrative   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       BS bioscience in school [place]       Married x 20 years as of 11/2018    Daughter 75 y.o and son 65 as of 11/2018    -daughter lives at home       DPR sister 68 [place]    Social Determinants of Health   Financial Resource Strain: Not on file  Food Insecurity: Not on file  Transportation Needs: Not on file  Physical Activity: Not on file  Stress: Not on file  Social Connections: Not on file  Intimate Partner Violence: Not on file   Current Meds  Medication Sig   amLODipine (NORVASC) 2.5 MG tablet TAKE 1 TABLET BY MOUTH IN  THE MORNING AND 1 TABLET AT BEDTIME DAILY   Cholecalciferol (VITAMIN D-3) 125 MCG (5000 UT) TABS Take by mouth daily at 12 noon.   sertraline (ZOLOFT) 25 MG tablet TAKE 1 TABLET BY MOUTH  DAILY IN THE MORNING   [DISCONTINUED] atorvastatin (LIPITOR) 40 MG tablet Take 1 tablet (40 mg total) by mouth daily after supper. Dc 20 mg   [DISCONTINUED] telmisartan (MICARDIS) 40 MG tablet TAKE 1 TABLET BY MOUTH  DAILY   [DISCONTINUED] Zoster Vaccine Adjuvanted [facility]) injection Inject 0.5  mLs into the muscle once for 1 dose. Due 07/25/21 up to less than 6 months   Allergies  Allergen Reactions   Sulfa Antibiotics     Hives    No results found for this or any previous visit (from the past 2160 hour(s)). Objective  Body mass index is 44.18 kg/m. Wt Readings from Last 3 Encounters:  05/27/21 257 lb 6.4 oz (116.8 kg)  11/19/20 241 lb (109.3 kg)  08/26/20 237 lb (107.5 kg)   Temp Readings from Last 3 Encounters:  05/27/21 98.4 F (36.9 C) (Oral)  11/19/20 98.1 F (36.7 C) (Oral)  08/26/20 98.3 F (36.8 C) (Oral)   BP Readings from Last 3 Encounters:  05/27/21 110/64  11/19/20 116/70  08/26/20 (!) 136/100   Pulse Readings from Last 3 Encounters:  05/27/21 80  11/19/20 74  08/26/20 75    Physical Exam Vitals and nursing note reviewed.  Constitutional:      Appearance: Normal appearance. She is well-developed  and well-groomed.  HENT:     Head: Normocephalic and atraumatic.  Eyes:     Conjunctiva/sclera: Conjunctivae normal.     Pupils: Pupils are equal, round, and reactive to light.  Cardiovascular:     Rate and Rhythm: Normal rate and regular rhythm.     Heart sounds: Murmur heard.  Pulmonary:     Effort: Pulmonary effort is normal.     Breath sounds: Normal breath sounds.  Abdominal:     General: Abdomen is flat. Bowel sounds are normal.     Tenderness: There is no abdominal tenderness.  Musculoskeletal:        General: No tenderness.  Skin:    General: Skin is warm and dry.  Neurological:     General: No focal deficit present.     Mental Status: She is alert and oriented to person, place, and time. Mental status is at baseline.     Cranial Nerves: Cranial nerves 2-12 are intact.     Sensory: Sensation is intact.     Motor: Motor function is intact.     Coordination: Coordination is intact.     Gait: Gait is intact.  Psychiatric:        Attention and Perception: Attention and perception normal.        Mood and Affect: Mood and affect normal.        Speech: Speech normal.        Behavior: Behavior normal. Behavior is cooperative.        Thought Content: Thought content normal.        Cognition and Memory: Cognition and memory normal.        Judgment: Judgment normal.    Assessment  Plan  Hypertension controlled- Plan: Comprehensive metabolic panel, Lipid panel, CBC with Differential/Platelet Norvasc 2.5 mg qd micardis 40 mg qd   Hyperlipidemia, unspecified hyperlipidemia type - Plan: atorvastatin (LIPITOR) 40 MG tablet, Lipid panel  Prediabetes - Plan: Hemoglobin A1c  Healthy diet and exercise   Cardiac murmur  Consider echo in the future   HM-cpe at f/u Flu shot utd  Tdap utd 01/09/19 Consider shingrix 1/2 today  covid vx 2/2  pfizer+ moderna booster  declines hep C/HIV    mammo 01/23/2019 negative ordered again  mammo 03/27/20 negative ordered sch 06/06/21   Pap  10/04/17 negative Dr. Kenton Kingfisher ob/gyn s/p hysterectomy due 10/05/22  11/22/20 This message is for you information, as your primary care physician alerted me to the question of PAP and need for gyn  care follow ups.  As far as PAP is concerned, I perform one every five years after a hysterectomy, to ward off developments of vaginal cancer.  Although this is a rare cancer, occasional checks are a good idea.  As far as gyn office visits, many will follow up yearly to monitor breast and pelvic exam, and assess bladder function or dysfunction, and to protect against other post-menopausal symptoms or concerns.  I am available to see you as desired.  Dr Kenton Kingfisher  Col[facility]oscopy [facility] 08/25/19 hyperplastic polps f/u in 10 years    Skin left back skin tag removed sent to [facility]     rec healthy diet and exercise   Short term pharmacy [facility]   Provider: Dr. Olivia Mackie McLean-Scocuzza-Internal Medicine

## 2021-05-30 ENCOUNTER — Encounter: Payer: Self-pay | Admitting: Internal Medicine

## 2021-06-02 ENCOUNTER — Other Ambulatory Visit: Payer: Self-pay | Admitting: Internal Medicine

## 2021-06-02 LAB — LIPID PANEL

## 2021-06-03 LAB — COMPREHENSIVE METABOLIC PANEL
ALT: 13 IU/L (ref 0–32)
AST: 16 IU/L (ref 0–40)
Albumin/Globulin Ratio: 1.6 (ref 1.2–2.2)
Albumin: 4 g/dL (ref 3.8–4.9)
Alkaline Phosphatase: 89 IU/L (ref 44–121)
BUN/Creatinine Ratio: 17 (ref 9–23)
BUN: 15 mg/dL (ref 6–24)
Bilirubin Total: 0.2 mg/dL (ref 0.0–1.2)
CO2: 24 mmol/L (ref 20–29)
Calcium: 9.2 mg/dL (ref 8.7–10.2)
Chloride: 104 mmol/L (ref 96–106)
Creatinine, Ser: 0.89 mg/dL (ref 0.57–1.00)
Globulin, Total: 2.5 g/dL (ref 1.5–4.5)
Glucose: 106 mg/dL — ABNORMAL HIGH (ref 70–99)
Potassium: 4.3 mmol/L (ref 3.5–5.2)
Sodium: 142 mmol/L (ref 134–144)
Total Protein: 6.5 g/dL (ref 6.0–8.5)
eGFR: 78 mL/min/{1.73_m2} (ref >59)

## 2021-06-03 LAB — LIPID PANEL
Chol/HDL Ratio: 3.7 ratio (ref 0.0–4.4)
Cholesterol, Total: 170 mg/dL (ref 100–199)
HDL: 46 mg/dL (ref >39)
LDL Chol Calc (NIH): 107 mg/dL — ABNORMAL HIGH (ref 0–99)
Triglycerides: 91 mg/dL (ref 0–149)
VLDL Cholesterol Cal: 17 mg/dL (ref 5–40)

## 2021-06-03 LAB — CBC WITH DIFFERENTIAL/PLATELET
Basophils Absolute: 0 10*3/uL (ref 0.0–0.2)
Basos: 0 %
EOS (ABSOLUTE): 0.2 10*3/uL (ref 0.0–0.4)
Eos: 2 %
Hematocrit: 41.4 % (ref 34.0–46.6)
Hemoglobin: 13.7 g/dL (ref 11.1–15.9)
Immature Grans (Abs): 0 10*3/uL (ref 0.0–0.1)
Immature Granulocytes: 0 %
Lymphocytes Absolute: 4.3 10*3/uL — ABNORMAL HIGH (ref 0.7–3.1)
Lymphs: 45 %
MCH: 28.5 pg (ref 26.6–33.0)
MCHC: 33.1 g/dL (ref 31.5–35.7)
MCV: 86 fL (ref 79–97)
Monocytes Absolute: 0.8 10*3/uL (ref 0.1–0.9)
Monocytes: 8 %
Neutrophils Absolute: 4.3 10*3/uL (ref 1.4–7.0)
Neutrophils: 45 %
Platelets: 264 10*3/uL (ref 150–450)
RBC: 4.81 x10E6/uL (ref 3.77–5.28)
RDW: 13.6 % (ref 11.7–15.4)
WBC: 9.6 10*3/uL (ref 3.4–10.8)

## 2021-06-03 LAB — VITAMIN D 25 HYDROXY (VIT D DEFICIENCY, FRACTURES): Vit D, 25-Hydroxy: 47.6 ng/mL (ref 30.0–100.0)

## 2021-06-03 LAB — HEMOGLOBIN A1C
Est. average glucose Bld gHb Est-mCnc: 123 mg/dL
Hgb A1c MFr Bld: 5.9 % — ABNORMAL HIGH (ref 4.8–5.6)

## 2021-06-03 LAB — TSH: TSH: 0.859 u[IU]/mL (ref 0.450–4.500)

## 2021-06-03 NOTE — Progress Notes (Signed)
I think its ok. Even if they are elevated, just slightly so with no clear increasing trend and normal flow cytometry. Ok to monitor q6 months for now

## 2021-06-06 ENCOUNTER — Other Ambulatory Visit: Payer: Self-pay

## 2021-06-06 ENCOUNTER — Ambulatory Visit
Admission: RE | Admit: 2021-06-06 | Discharge: 2021-06-06 | Disposition: A | Discharge status: Home / Self Care | Payer: 59 | Source: Ambulatory Visit | Attending: Internal Medicine | Admitting: Internal Medicine

## 2021-06-06 DIAGNOSIS — Z1231 Encounter for screening mammogram for malignant neoplasm of breast: Secondary | ICD-10-CM | POA: Diagnosis present

## 2021-07-30 ENCOUNTER — Ambulatory Visit: Payer: 59 | Admitting: *Deleted

## 2021-07-30 ENCOUNTER — Telehealth: Payer: Self-pay | Admitting: Internal Medicine

## 2021-07-30 NOTE — Telephone Encounter (Signed)
Patient is requesting to get her MMR vaccine and 2nd shingles shot. Please place order for MMR will schedule for shingles shot. ?

## 2021-07-30 NOTE — Telephone Encounter (Signed)
Have pt do MMR titer [facility]  ? ?If low rec MMR vaccine but would wait 1 month after or before shingrix  ?Can do 2nd dose 30 days after 1st MMR if needed  ?

## 2021-07-30 NOTE — Telephone Encounter (Signed)
OK to give MMR. ?

## 2021-07-30 NOTE — Progress Notes (Signed)
Pt came into office for scheduled for Shingrix #2. ?When brought pt back, pt advised me that she also needs MMR vaccine for work. ?Pt advised that she needs an office visit for MMR approval. Can get shingrix today but will have to wait a few weeks between Shringrix and MMR. ? ?Pt decided to for-go having Shringrix injec today and instead schedule an office visit w/ Dr Tracy for MMR. ?Pt stated she needs MMR more urgently.  ?

## 2021-07-31 NOTE — Telephone Encounter (Signed)
Patient called and note was read. Patient will go to [facility] before her appointment with Dr French Ana. ?

## 2021-07-31 NOTE — Telephone Encounter (Signed)
Left message to return call. ? ?Okay to inform Patient to have lab work done at [facility] to determine if vaccination is needed. Lab order has been placed.  ?

## 2021-07-31 NOTE — Telephone Encounter (Signed)
Patient calling back in and informed by the front desk  

## 2021-08-02 LAB — MEASLES/MUMPS/RUBELLA IMMUNITY
MUMPS ABS, IGG: <9 AU/mL — ABNORMAL LOW (ref >10.9)
RUBEOLA AB, IGG: 153 AU/mL (ref >16.4)
Rubella Antibodies, IGG: 5.91 index (ref >0.99)

## 2021-08-12 ENCOUNTER — Other Ambulatory Visit: Payer: Self-pay | Admitting: Internal Medicine

## 2021-08-12 ENCOUNTER — Encounter: Payer: Self-pay | Admitting: Internal Medicine

## 2021-08-12 ENCOUNTER — Telehealth: Payer: Self-pay | Admitting: Internal Medicine

## 2021-08-12 ENCOUNTER — Ambulatory Visit: Payer: 59

## 2021-08-12 VITALS — BP 128/68 | HR 74 | Temp 98.2°F | Resp 14 | Ht 64.0 in | Wt 263.6 lb

## 2021-08-12 DIAGNOSIS — Z23 Encounter for immunization: Secondary | ICD-10-CM

## 2021-08-12 MED ORDER — MEASLES, MUMPS & RUBELLA VAC IJ SOLR: 0.5000 mL | Freq: Once | INTRAMUSCULAR | 1 refills | Status: AC

## 2021-08-12 NOTE — Telephone Encounter (Signed)
[facility] can go to [facility] call and see if can give MMR today if not there health dept  ?2nd dose in 30 days ?Sorry for this ? ?

## 2021-08-14 NOTE — Progress Notes (Unsigned)
Close  

## 2021-08-19 ENCOUNTER — Ambulatory Visit (INDEPENDENT_AMBULATORY_CARE_PROVIDER_SITE_OTHER): Payer: 59

## 2021-08-19 DIAGNOSIS — Z23 Encounter for immunization: Secondary | ICD-10-CM | POA: Diagnosis not present

## 2021-08-19 NOTE — Progress Notes (Signed)
Patient came in today to receive MMR vaccine given in left arm SQ. Patient tolerated with no signs of distress.  ?

## 2021-09-18 ENCOUNTER — Ambulatory Visit (INDEPENDENT_AMBULATORY_CARE_PROVIDER_SITE_OTHER): Payer: 59

## 2021-09-18 DIAGNOSIS — Z23 Encounter for immunization: Secondary | ICD-10-CM

## 2021-09-18 NOTE — Progress Notes (Signed)
Donna Hinton presents today for injection per MD orders. MMR administered SQ in left Upper Arm. Administration without incident. Patient tolerated well. Myquan Schaumburg,cma

## 2021-11-25 ENCOUNTER — Ambulatory Visit (INDEPENDENT_AMBULATORY_CARE_PROVIDER_SITE_OTHER): Payer: 59 | Admitting: Internal Medicine

## 2021-11-25 ENCOUNTER — Encounter: Payer: Self-pay | Admitting: Internal Medicine

## 2021-11-25 VITALS — BP 134/70 | HR 72 | Temp 98.7°F | Ht 64.0 in | Wt 272.4 lb

## 2021-11-25 DIAGNOSIS — Z1389 Encounter for screening for other disorder: Secondary | ICD-10-CM

## 2021-11-25 DIAGNOSIS — I10 Essential (primary) hypertension: Secondary | ICD-10-CM

## 2021-11-25 DIAGNOSIS — Z1231 Encounter for screening mammogram for malignant neoplasm of breast: Secondary | ICD-10-CM

## 2021-11-25 DIAGNOSIS — E785 Hyperlipidemia, unspecified: Secondary | ICD-10-CM

## 2021-11-25 DIAGNOSIS — F419 Anxiety disorder, unspecified: Secondary | ICD-10-CM

## 2021-11-25 DIAGNOSIS — Z0000 Encounter for general adult medical examination without abnormal findings: Secondary | ICD-10-CM | POA: Diagnosis not present

## 2021-11-25 DIAGNOSIS — Z23 Encounter for immunization: Secondary | ICD-10-CM | POA: Diagnosis not present

## 2021-11-25 DIAGNOSIS — R7303 Prediabetes: Secondary | ICD-10-CM

## 2021-11-25 DIAGNOSIS — Z90710 Acquired absence of both cervix and uterus: Secondary | ICD-10-CM

## 2021-11-25 DIAGNOSIS — Z0184 Encounter for antibody response examination: Secondary | ICD-10-CM

## 2021-11-25 MED ORDER — ATORVASTATIN CALCIUM 40 MG PO TABS: 40.0000 mg | ORAL_TABLET | Freq: Every day | ORAL | 3 refills | Status: DC

## 2021-11-25 MED ORDER — AMLODIPINE BESYLATE 2.5 MG PO TABS: ORAL_TABLET | ORAL | 3 refills | Status: DC

## 2021-11-25 MED ORDER — TELMISARTAN 40 MG PO TABS: 40.0000 mg | ORAL_TABLET | Freq: Every day | ORAL | 3 refills | Status: DC

## 2021-11-25 MED ORDER — SERTRALINE HCL 25 MG PO TABS: 25.0000 mg | ORAL_TABLET | Freq: Every morning | ORAL | 3 refills | Status: DC

## 2021-11-25 NOTE — Progress Notes (Signed)
2nd shingles documented in chart

## 2021-11-25 NOTE — Progress Notes (Signed)
Chief Complaint  Patient presents with   Follow-up    6 month f/u with no other concerns   Annual Exam   Annual  1. Htn sl elevated today on telmisartan 40 mg qd and norvasc 2.5 bid wt trending up rec healthy diet and exercise  2. 2nd shingrix due today check MMR status     Review of Systems  Constitutional:  Negative for weight loss.  HENT:  Negative for hearing loss.   Eyes:  Negative for blurred vision.  Respiratory:  Negative for shortness of breath.   Cardiovascular:  Negative for chest pain.  Gastrointestinal:  Negative for abdominal pain and blood in stool.  Genitourinary:  Negative for dysuria.  Musculoskeletal:  Negative for falls and joint pain.  Skin:  Negative for rash.  Neurological:  Negative for headaches.  Psychiatric/Behavioral:  Negative for depression.    Past Medical History:  Diagnosis Date   COVID-19    03/2020 loss of taste and smell   Heart murmur    HTN (hypertension)    Past Surgical History:  Procedure Laterality Date   ABDOMINAL HYSTERECTOMY     ovaries intact had 2011/2012 due to fibroids/DUB   COLONOSCOPY WITH PROPOFOL N/A 08/25/2019   Procedure: COLONOSCOPY WITH PROPOFOL;  Surgeon: Jonathon Bellows, MD;  Location: [facility] ENDOSCOPY;  Service: Gastroenterology;  Laterality: N/A;  PRIORITY 4   lsh     Family History  Problem Relation Age of Onset   Breast cancer Mother 96   Dementia Mother    Diabetes Father    Hypertension Father    Hypertension Sister    Diabetes Cousin    Social History   Socioeconomic History   Marital status: Married    Spouse name: Not on file   Number of children: Not on file   Years of education: Not on file   Highest education level: Not on file  Occupational History   Not on file  Tobacco Use   Smoking status: Never   Smokeless tobacco: Never  Vaping Use   Vaping Use: Never used  Substance and Sexual Activity   Alcohol use: Never   Drug use: Never   Sexual activity: Yes  Other Topics Concern   Not on  file  Social History Narrative   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   [DISCONTINUED] amLODipine (NORVASC) 2.5 MG tablet TAKE 1 TABLET BY MOUTH IN  THE MORNING AND 1 TABLET AT BEDTIME DAILY   [DISCONTINUED] atorvastatin (LIPITOR) 40 MG tablet Take 1 tablet (40 mg total) by mouth daily after supper. Dc 20 mg   [DISCONTINUED] sertraline (ZOLOFT) 25 MG tablet TAKE 1 TABLET BY MOUTH  DAILY IN THE MORNING   [DISCONTINUED] telmisartan (MICARDIS) 40 MG  tablet Take 1 tablet (40 mg total) by mouth daily.   Allergies  Allergen Reactions   Sulfa Antibiotics     Hives    No results found for this or any previous visit (from the past 2160 hour(s)). Objective  Body mass index is 46.76 kg/m. Wt Readings from Last 3 Encounters:  11/25/21 272 lb 6.4 oz (123.6 kg)  08/12/21 263 lb 9.6 oz (119.6 kg)  05/27/21 257 lb 6.4 oz (116.8 kg)   Temp Readings from Last 3 Encounters:  11/25/21 98.7 F (37.1 C) (Oral)  08/12/21 98.2 F (36.8 C) (Oral)  05/27/21 98.4 F (36.9 C) (Oral)   BP Readings from Last 3 Encounters:  11/25/21 134/70  08/12/21 128/68  05/27/21 110/64   Pulse Readings from Last 3 Encounters:  11/25/21 72  08/12/21 74  05/27/21 80    Physical Exam Vitals and nursing note reviewed.  Constitutional:      Appearance: Normal appearance. She is well-developed and well-groomed.  HENT:      Head: Normocephalic and atraumatic.  Eyes:     Conjunctiva/sclera: Conjunctivae normal.     Pupils: Pupils are equal, round, and reactive to light.  Cardiovascular:     Rate and Rhythm: Normal rate and regular rhythm.     Heart sounds: Normal heart sounds. No murmur heard. Pulmonary:     Effort: Pulmonary effort is normal.     Breath sounds: Normal breath sounds.  Abdominal:     General: Abdomen is flat. Bowel sounds are normal.     Tenderness: There is no abdominal tenderness.  Musculoskeletal:        General: No tenderness.  Skin:    General: Skin is warm and dry.  Neurological:     General: No focal deficit present.     Mental Status: She is alert and oriented to person, place, and time. Mental status is at baseline.     Cranial Nerves: Cranial nerves 2-12 are intact.     Motor: Motor function is intact.     Coordination: Coordination is intact.     Gait: Gait is intact.  Psychiatric:        Attention and Perception: Attention and perception normal.        Mood and Affect: Mood and affect normal.        Speech: Speech normal.        Behavior: Behavior normal. Behavior is cooperative.        Thought Content: Thought content normal.        Cognition and Memory: Cognition and memory normal.        Judgment: Judgment normal.     Assessment  Plan  Annual physical exam - Plan: Comprehensive metabolic panel, Lipid panel, CBC with Differential/Platelet See below   S/P hysterectomy - Plan: Ambulatory referral to Obstetrics / Gynecology    Essential hypertension improved - Plan: telmisartan (MICARDIS) 40 MG tablet, amLODipine (NORVASC) 2.5 MG tablet bid , Comprehensive metabolic panel, Lipid panel, CBC with Differential/Platelet  Anxiety - Plan: sertraline (ZOLOFT) 25 MG tablet  Hyperlipidemia, unspecified hyperlipidemia type - Plan: atorvastatin (LIPITOR) 40 MG tablet   HM Flu shot utd  Tdap utd 01/09/19 2/2 MMR  1/2 shingrix 2nd shot given today covid vx 2/2 not had  booster had covid 03/2020 declines hep C/HIV    mammo 06/06/22 negative ordered    Pap 10/04/17 negative Dr. Kenton Kingfisher ob/gyn s/p hysterectomy as of 11/25/21 change to [place]   11/22/20 from Dr. Kenton Kingfisher This message is  for you information, as your primary care physician alerted me to the question of PAP and need for gyn care follow ups.  As far as PAP is concerned, I perform one every five years after a hysterectomy, to ward off developments of vaginal cancer.  Although this is a rare cancer, occasional checks are a good idea.  As far as gyn office visits, many will follow up yearly to monitor breast and pelvic exam, and assess bladder function or dysfunction, and to protect against other post-menopausal symptoms or concerns.  I am available to see you as desired.  Dr Kenton Kingfisher  Colonoscopy [facility] 08/25/19 hyperplastic polps f/u in 10 years    Skin left back skin tag removed sent to [facility]     rec healthy diet and exercise   Provider: Dr. Olivia Mackie McLean-Scocuzza-Internal Medicine

## 2021-11-25 NOTE — Patient Instructions (Addendum)
Exercising to Lose Weight ?Getting regular exercise is important for everyone. It is especially important if you are overweight. Being overweight increases your risk of heart disease, stroke, diabetes, high blood pressure, and several types of cancer. Exercising, and reducing the calories you consume, can help you lose weight and improve fitness and health. ?Exercise can be moderate or vigorous intensity. To lose weight, most people need to do a certain amount of moderate or vigorous-intensity exercise each week. ?How can exercise affect me? ?You lose weight when you exercise enough to burn more calories than you eat. Exercise also reduces body fat and builds muscle. The more muscle you have, the more calories you burn. Exercise also: ?Improves mood. ?Reduces stress and tension. ?Improves your overall fitness, flexibility, and endurance. ?Increases bone strength. ?Moderate-intensity exercise ? ?Moderate-intensity exercise is any activity that gets you moving enough to burn at least three times more energy (calories) than if you were sitting. ?Examples of moderate exercise include: ?Walking a mile in 15 minutes. ?Doing light yard work. ?Biking at an easy pace. ?Most people should get at least 150 minutes of moderate-intensity exercise a week to maintain their body weight. ?Vigorous-intensity exercise ?Vigorous-intensity exercise is any activity that gets you moving enough to burn at least six times more calories than if you were sitting. When you exercise at this intensity, you should be working hard enough that you are not able to carry on a conversation. ?Examples of vigorous exercise include: ?Running. ?Playing a team sport, such as football, basketball, and soccer. ?Jumping rope. ?Most people should get at least 75 minutes a week of vigorous exercise to maintain their body weight. ?What actions can I take to lose weight? ?The amount of exercise you need to lose weight depends on: ?Your age. ?The type of  exercise. ?Any health conditions you have. ?Your overall physical ability. ?Talk to your health care provider about how much exercise you need and what types of activities are safe for you. ?Nutrition ? ?Make changes to your diet as told by your health care provider or diet and nutrition specialist (dietitian). This may include: ?Eating fewer calories. ?Eating more protein. ?Eating less unhealthy fats. ?Eating a diet that includes fresh fruits and vegetables, whole grains, low-fat dairy products, and lean protein. ?Avoiding foods with added fat, salt, and sugar. ?Drink plenty of water while you exercise to prevent dehydration or heat stroke. ?Activity ?Choose an activity that you enjoy and set realistic goals. Your health care provider can help you make an exercise plan that works for you. ?Exercise at a moderate or vigorous intensity most days of the week. ?The intensity of exercise may vary from person to person. You can tell how intense a workout is for you by paying attention to your breathing and heartbeat. Most people will notice their breathing and heartbeat get faster with more intense exercise. ?Do resistance training twice each week, such as: ?Push-ups. ?Sit-ups. ?Lifting weights. ?Using resistance bands. ?Getting short amounts of exercise can be just as helpful as long, structured periods of exercise. If you have trouble finding time to exercise, try doing these things as part of your daily routine: ?Get up, stretch, and walk around every 30 minutes throughout the day. ?Go for a walk during your lunch break. ?Park your car farther away from your destination. ?If you take public transportation, get off one stop early and walk the rest of the way. ?Make phone calls while standing up and walking around. ?Take the stairs instead of elevators or escalators. ?  Wear comfortable clothes and shoes with good support. Do not exercise so much that you hurt yourself, feel dizzy, or get very short of breath. Where to  find more information [place] Department of Health and Human Services: ThisPath.fi Centers for Disease Control and Prevention: FootballExhibition.com.br Contact a health care provider: Before starting a new exercise program. If you have questions or concerns about your weight. If you have a medical problem that keeps you from exercising. Get help right away if: You have any of the following while exercising: Injury. Dizziness. Difficulty breathing or shortness of breath that does not go away when you stop exercising. Chest pain. Rapid heartbeat. These symptoms may represent a serious problem that is an emergency. Do not wait to see if the symptoms will go away. Get medical help right away. Call your local emergency services (911 in the [place] Do not drive yourself to the hospital. Summary Getting regular exercise is especially important if you are overweight. Being overweight increases your risk of heart disease, stroke, diabetes, high blood pressure, and several types of cancer. Losing weight happens when you burn more calories than you eat. Reducing the amount of calories you eat, and getting regular moderate or vigorous exercise each week, helps you lose weight. This information is not intended to replace advice given to you by your health care provider. Make sure you discuss any questions you have with your health care provider. Document Revised: 06/09/2020 Document Reviewed: 06/09/2020 Elsevier Patient Education  2023 Elsevier Inc.  Zoster Vaccine, Recombinant injection What is this medication? ZOSTER VACCINE (ZOS ter vak SEEN) is a vaccine used to reduce the risk of getting shingles. This vaccine is not used to treat shingles or nerve pain from shingles. This medicine may be used for other purposes; ask your health care provider or pharmacist if you have questions. COMMON BRAND NAME(S): [facility] What should I tell my care team before I take this medication? They need to know if you have any of  these conditions: cancer immune system problems an unusual or allergic reaction to Zoster vaccine, other medications, foods, dyes, or preservatives pregnant or trying to get pregnant breast-feeding How should I use this medication? This vaccine is injected into a muscle. It is given by a health care provider. A copy of Vaccine Information Statements will be given before each vaccination. Be sure to read this information carefully each time. This sheet may change often. Talk to your health care provider about the use of this vaccine in children. This vaccine is not approved for use in children. Overdosage: If you think you have taken too much of this medicine contact a poison control center or emergency room at once. NOTE: This medicine is only for you. Do not share this medicine with others. What if I miss a dose? Keep appointments for follow-up (booster) doses. It is important not to miss your dose. Call your health care provider if you are unable to keep an appointment. What may interact with this medication? medicines that suppress your immune system medicines to treat cancer steroid medicines like prednisone or cortisone This list may not describe all possible interactions. Give your health care provider a list of all the medicines, herbs, non-prescription drugs, or dietary supplements you use. Also tell them if you smoke, drink alcohol, or use illegal drugs. Some items may interact with your medicine. What should I watch for while using this medication? Visit your health care provider regularly. This vaccine, like all vaccines, may not fully protect everyone. What  side effects may I notice from receiving this medication? Side effects that you should report to your doctor or health care professional as soon as possible: allergic reactions (skin rash, itching or hives; swelling of the face, lips, or tongue) trouble breathing Side effects that usually do not require medical attention  (report these to your doctor or health care professional if they continue or are bothersome): chills headache fever nausea pain, redness, or irritation at site where injected tiredness vomiting This list may not describe all possible side effects. Call your doctor for medical advice about side effects. You may report side effects to FDA at 1-800-FDA-1088. Where should I keep my medication? This vaccine is only given by a health care provider. It will not be stored at home. NOTE: This sheet is a summary. It may not cover all possible information. If you have questions about this medicine, talk to your doctor, pharmacist, or health care provider.  2023 Elsevier/Gold Standard (2021-03-14 00:00:00)

## 2021-11-25 NOTE — Addendum Note (Signed)
Addended by: Donavan Foil on: 11/25/2021 09:31 AM   Modules accepted: Orders

## 2021-11-27 ENCOUNTER — Encounter: Payer: Self-pay | Admitting: Internal Medicine

## 2021-12-02 LAB — COMPREHENSIVE METABOLIC PANEL
ALT: 13 IU/L (ref 0–32)
AST: 10 IU/L (ref 0–40)
Albumin/Globulin Ratio: 1.5 (ref 1.2–2.2)
Albumin: 4.1 g/dL (ref 3.8–4.9)
Alkaline Phosphatase: 93 IU/L (ref 44–121)
BUN/Creatinine Ratio: 17 (ref 9–23)
BUN: 15 mg/dL (ref 6–24)
Bilirubin Total: 0.4 mg/dL (ref 0.0–1.2)
CO2: 25 mmol/L (ref 20–29)
Calcium: 9.6 mg/dL (ref 8.7–10.2)
Chloride: 101 mmol/L (ref 96–106)
Creatinine, Ser: 0.86 mg/dL (ref 0.57–1.00)
Globulin, Total: 2.7 g/dL (ref 1.5–4.5)
Glucose: 101 mg/dL — ABNORMAL HIGH (ref 70–99)
Potassium: 4.3 mmol/L (ref 3.5–5.2)
Sodium: 141 mmol/L (ref 134–144)
Total Protein: 6.8 g/dL (ref 6.0–8.5)
eGFR: 81 mL/min/{1.73_m2} (ref >59)

## 2021-12-02 LAB — LIPID PANEL
Chol/HDL Ratio: 4.2 ratio (ref 0.0–4.4)
Cholesterol, Total: 188 mg/dL (ref 100–199)
HDL: 45 mg/dL (ref >39)
LDL Chol Calc (NIH): 122 mg/dL — ABNORMAL HIGH (ref 0–99)
Triglycerides: 114 mg/dL (ref 0–149)
VLDL Cholesterol Cal: 21 mg/dL (ref 5–40)

## 2021-12-02 LAB — CBC WITH DIFFERENTIAL/PLATELET
Basophils Absolute: 0 10*3/uL (ref 0.0–0.2)
Basos: 0 %
EOS (ABSOLUTE): 0.3 10*3/uL (ref 0.0–0.4)
Eos: 3 %
Hematocrit: 40 % (ref 34.0–46.6)
Hemoglobin: 13.5 g/dL (ref 11.1–15.9)
Immature Grans (Abs): 0 10*3/uL (ref 0.0–0.1)
Immature Granulocytes: 0 %
Lymphocytes Absolute: 3.7 10*3/uL — ABNORMAL HIGH (ref 0.7–3.1)
Lymphs: 41 %
MCH: 29.3 pg (ref 26.6–33.0)
MCHC: 33.8 g/dL (ref 31.5–35.7)
MCV: 87 fL (ref 79–97)
Monocytes Absolute: 0.7 10*3/uL (ref 0.1–0.9)
Monocytes: 7 %
Neutrophils Absolute: 4.5 10*3/uL (ref 1.4–7.0)
Neutrophils: 49 %
Platelets: 271 10*3/uL (ref 150–450)
RBC: 4.61 x10E6/uL (ref 3.77–5.28)
RDW: 13.2 % (ref 11.7–15.4)
WBC: 9.1 10*3/uL (ref 3.4–10.8)

## 2021-12-02 LAB — MEASLES/MUMPS/RUBELLA IMMUNITY
MUMPS ABS, IGG: >300 AU/mL (ref >10.9)
RUBEOLA AB, IGG: 260 AU/mL (ref >16.4)
Rubella Antibodies, IGG: 22.7 index (ref >0.99)

## 2021-12-02 LAB — URINALYSIS, ROUTINE W REFLEX MICROSCOPIC
Bilirubin, UA: NEGATIVE
Glucose, UA: NEGATIVE
Ketones, UA: NEGATIVE
Leukocytes,UA: NEGATIVE
Nitrite, UA: NEGATIVE
Protein,UA: NEGATIVE
RBC, UA: NEGATIVE
Specific Gravity, UA: 1.023 (ref 1.005–1.030)
Urobilinogen, Ur: 1 mg/dL (ref 0.2–1.0)
pH, UA: 5.5 (ref 5.0–7.5)

## 2021-12-02 LAB — HEMOGLOBIN A1C
Est. average glucose Bld gHb Est-mCnc: 120 mg/dL
Hgb A1c MFr Bld: 5.8 % — ABNORMAL HIGH (ref 4.8–5.6)

## 2021-12-31 LAB — HM PAP SMEAR: HM Pap smear: NORMAL

## 2022-05-28 ENCOUNTER — Encounter: Payer: 59 | Admitting: Family Medicine

## 2022-07-29 ENCOUNTER — Ambulatory Visit
Admission: RE | Admit: 2022-07-29 | Discharge: 2022-07-29 | Disposition: A | Discharge status: Home / Self Care | Payer: 59 | Source: Ambulatory Visit | Attending: Internal Medicine | Admitting: Internal Medicine

## 2022-07-29 DIAGNOSIS — Z1231 Encounter for screening mammogram for malignant neoplasm of breast: Secondary | ICD-10-CM | POA: Diagnosis not present

## 2022-09-09 ENCOUNTER — Encounter: Payer: 59 | Admitting: Family Medicine

## 2022-10-07 ENCOUNTER — Other Ambulatory Visit: Payer: Self-pay | Admitting: *Deleted

## 2022-10-07 DIAGNOSIS — I10 Essential (primary) hypertension: Secondary | ICD-10-CM

## 2022-10-07 DIAGNOSIS — F419 Anxiety disorder, unspecified: Secondary | ICD-10-CM

## 2022-10-07 DIAGNOSIS — E785 Hyperlipidemia, unspecified: Secondary | ICD-10-CM

## 2022-10-07 MED ORDER — TELMISARTAN 40 MG PO TABS: 40.0000 mg | ORAL_TABLET | Freq: Every day | ORAL | 0 refills | Status: DC

## 2022-10-07 MED ORDER — ATORVASTATIN CALCIUM 40 MG PO TABS: 40.0000 mg | ORAL_TABLET | Freq: Every day | ORAL | 0 refills | Status: DC

## 2022-10-07 MED ORDER — SERTRALINE HCL 25 MG PO TABS: 25.0000 mg | ORAL_TABLET | Freq: Every morning | ORAL | 0 refills | Status: DC

## 2022-10-07 NOTE — Telephone Encounter (Signed)
Pt rescheduled TOC appt for:  11/05/22 with Dr. Clent Ridges

## 2022-10-23 ENCOUNTER — Other Ambulatory Visit: Payer: Self-pay | Admitting: Family Medicine

## 2022-10-23 DIAGNOSIS — E785 Hyperlipidemia, unspecified: Secondary | ICD-10-CM

## 2022-10-23 DIAGNOSIS — I10 Essential (primary) hypertension: Secondary | ICD-10-CM

## 2022-10-23 DIAGNOSIS — F419 Anxiety disorder, unspecified: Secondary | ICD-10-CM

## 2022-10-26 ENCOUNTER — Other Ambulatory Visit: Payer: Self-pay

## 2022-10-26 DIAGNOSIS — I10 Essential (primary) hypertension: Secondary | ICD-10-CM

## 2022-10-26 MED ORDER — AMLODIPINE BESYLATE 2.5 MG PO TABS
ORAL_TABLET | ORAL | 0 refills | Status: DC
Start: 2022-10-26 — End: 2022-12-21

## 2022-10-26 NOTE — Telephone Encounter (Signed)
Last filled by TMS 

## 2022-11-05 ENCOUNTER — Encounter: Payer: 59 | Admitting: Family Medicine

## 2022-12-10 ENCOUNTER — Encounter (INDEPENDENT_AMBULATORY_CARE_PROVIDER_SITE_OTHER): Payer: Self-pay

## 2022-12-20 ENCOUNTER — Other Ambulatory Visit: Payer: Self-pay | Admitting: Family Medicine

## 2022-12-20 DIAGNOSIS — I10 Essential (primary) hypertension: Secondary | ICD-10-CM

## 2023-01-05 ENCOUNTER — Encounter: Payer: 59 | Admitting: Family Medicine

## 2023-01-11 ENCOUNTER — Other Ambulatory Visit: Payer: Self-pay | Admitting: Family Medicine

## 2023-01-11 DIAGNOSIS — F419 Anxiety disorder, unspecified: Secondary | ICD-10-CM

## 2023-01-11 DIAGNOSIS — I10 Essential (primary) hypertension: Secondary | ICD-10-CM

## 2023-01-11 DIAGNOSIS — E785 Hyperlipidemia, unspecified: Secondary | ICD-10-CM

## 2023-01-25 ENCOUNTER — Other Ambulatory Visit: Payer: Self-pay | Admitting: Family Medicine

## 2023-01-25 DIAGNOSIS — F419 Anxiety disorder, unspecified: Secondary | ICD-10-CM

## 2023-01-26 ENCOUNTER — Other Ambulatory Visit: Payer: Self-pay | Admitting: Family Medicine

## 2023-01-26 DIAGNOSIS — E785 Hyperlipidemia, unspecified: Secondary | ICD-10-CM

## 2023-01-26 DIAGNOSIS — I10 Essential (primary) hypertension: Secondary | ICD-10-CM

## 2023-02-16 ENCOUNTER — Other Ambulatory Visit: Payer: Self-pay | Admitting: Family Medicine

## 2023-02-16 DIAGNOSIS — F419 Anxiety disorder, unspecified: Secondary | ICD-10-CM

## 2023-02-22 ENCOUNTER — Encounter: Payer: 59 | Admitting: Family Medicine

## 2023-04-29 ENCOUNTER — Encounter: Payer: 59 | Admitting: Family Medicine

## 2023-07-19 ENCOUNTER — Encounter: Payer: 59 | Admitting: Family Medicine

## 2023-08-31 ENCOUNTER — Other Ambulatory Visit: Payer: Self-pay

## 2023-08-31 ENCOUNTER — Ambulatory Visit

## 2023-08-31 ENCOUNTER — Telehealth

## 2023-08-31 VITALS — BP 130/66 | HR 89 | Temp 98.4°F | Ht 64.0 in | Wt 268.8 lb

## 2023-08-31 DIAGNOSIS — E785 Hyperlipidemia, unspecified: Secondary | ICD-10-CM

## 2023-08-31 DIAGNOSIS — F419 Anxiety disorder, unspecified: Secondary | ICD-10-CM | POA: Diagnosis not present

## 2023-08-31 DIAGNOSIS — R21 Rash and other nonspecific skin eruption: Secondary | ICD-10-CM | POA: Insufficient documentation

## 2023-08-31 DIAGNOSIS — I10 Essential (primary) hypertension: Secondary | ICD-10-CM | POA: Diagnosis not present

## 2023-08-31 DIAGNOSIS — G4733 Obstructive sleep apnea (adult) (pediatric): Secondary | ICD-10-CM

## 2023-08-31 DIAGNOSIS — R7303 Prediabetes: Secondary | ICD-10-CM | POA: Diagnosis not present

## 2023-08-31 DIAGNOSIS — E662 Morbid (severe) obesity with alveolar hypoventilation: Secondary | ICD-10-CM | POA: Insufficient documentation

## 2023-08-31 DIAGNOSIS — R011 Cardiac murmur, unspecified: Secondary | ICD-10-CM | POA: Insufficient documentation

## 2023-08-31 DIAGNOSIS — E66813 Obesity, class 3: Secondary | ICD-10-CM | POA: Insufficient documentation

## 2023-08-31 MED ORDER — AMLODIPINE BESYLATE 2.5 MG PO TABS: ORAL_TABLET | ORAL | 3 refills | Status: AC

## 2023-08-31 MED ORDER — TELMISARTAN 40 MG PO TABS: 40.0000 mg | ORAL_TABLET | Freq: Every day | ORAL | 3 refills | Status: AC

## 2023-08-31 MED ORDER — WEGOVY 0.5 MG/0.5ML ~~LOC~~ SOAJ: 0.5000 mg | SUBCUTANEOUS | 0 refills | Status: DC

## 2023-08-31 MED ORDER — WEGOVY 0.25 MG/0.5ML ~~LOC~~ SOAJ: 0.2500 mg | SUBCUTANEOUS | 0 refills | Status: DC

## 2023-08-31 MED ORDER — KETOCONAZOLE 2 % EX SHAM: 1.0000 | MEDICATED_SHAMPOO | CUTANEOUS | 0 refills | Status: AC

## 2023-08-31 NOTE — Progress Notes (Signed)
 Established Patient Office Visit   Subjective  Patient ID: Donna Hinton, female    DOB: [birth year]  Age: 53 y.o. MRN: [id number]  Chief Complaint  Patient presents with   Transitions Of Care   She  has a past medical history of COVID-19, Heart murmur, and HTN (hypertension).  Patient of previous practitioner in the clinic Dr. Lawrnce Pretzel presenting for transfer of care. Last visit with Dr. Lawrnce Pretzel was on 11/25/2021.   HPI 1) Hypertension:  On Amlodipine  2.5 mg twice daily. Telmisartan  40 mg daily. She does not check BP at home on a regular basis. No chest pain, swelling.   2) Hyperlipidemia:  Last lipid panel: 12/01/2021, LDL 122 On Lipitor 40 mg.   3) Prediabetes, Last A1c: 5.8% on 12/01/2021.  Diet: Mixed of homemade, fast food. Exercise: Walking at work.   4) Anxiety: On Zoloft  25 mg, once a day. No SI/HI.   5) Weight concern: Patient reports she has tried weight watchers, exercising regularly in the past but has had hard time with maintaining weight loss. Patient is interested in weight loss pharmacological intervention. Patient declines h/o thyroid cancer.   6) OSA: Currently uses CPAP 7-8 hours per night. She has not been evaluated by sleep specialist in more than 2 years.   7) Rash: right temporal aspect, that is itchy, present for about one week.   8) Patient goes to [facility] for annual physical. Last PAP/HPV co-testing was on 12/31/21 which was normal.   9) Murmur: Patient has been told she has a h/o cardiac murmur. She denies chest pain, dyspnea, palpitations. Has not have echo done.   ROS As per HPI    Objective:     BP 130/66   Pulse 89   Temp 98.4 F (36.9 C) (Oral)   Ht 5\' 4"  (1.626 m)   Wt 268 lb 12.8 oz (121.9 kg)   SpO2 94%   BMI 46.14 kg/m      08/31/2023   10:58 AM 11/25/2021    8:41 AM 08/12/2021    9:12 AM  Depression screen PHQ 2/9  Decreased Interest 0 0 0  Down, Depressed, Hopeless 0 0 0  PHQ - 2 Score 0 0 0  Altered sleeping 0     Tired, decreased energy 0    Change in appetite 1    Feeling bad or failure about yourself  0    Trouble concentrating 0    Moving slowly or fidgety/restless 0    Suicidal thoughts 0    PHQ-9 Score 1    Difficult doing work/chores Not difficult at all        08/31/2023   10:58 AM 07/18/2019    4:15 PM  GAD 7 : Generalized Anxiety Score  Nervous, Anxious, on Edge 0 0  Control/stop worrying 0 0  Worry too much - different things 0 0  Trouble relaxing 0 0  Restless 0 0  Easily annoyed or irritable 0 0  Afraid - awful might happen 0 0  Total GAD 7 Score 0 0  Anxiety Difficulty Not difficult at all Not difficult at all    Physical Exam Constitutional:      Appearance: Normal appearance. She is obese.  HENT:     Head: Normocephalic and atraumatic.     Right Ear: Tympanic membrane normal.     Left Ear: Tympanic membrane normal.     Mouth/Throat:     Mouth: Mucous membranes are moist.  Neck:     Thyroid:  No thyroid mass or thyroid tenderness.  Cardiovascular:     Rate and Rhythm: Normal rate and regular rhythm.     Heart sounds: Murmur (grade III systolic murmur, loudest on left second ICS) heard.  Pulmonary:     Effort: Pulmonary effort is normal.     Breath sounds: Normal breath sounds.  Abdominal:     General: Abdomen is protuberant. Bowel sounds are normal.     Palpations: Abdomen is soft.  Musculoskeletal:     Cervical back: Normal range of motion and neck supple. No rigidity.     Right lower leg: No edema.     Left lower leg: No edema.  Lymphadenopathy:     Cervical: No cervical adenopathy.  Skin:    General: Skin is warm.     Comments: Single scaly patch on right temporal aspect without hair loss, +itching.  Neurological:     Mental Status: She is alert and oriented to person, place, and time.  Psychiatric:        Mood and Affect: Mood normal.        Behavior: Behavior normal.       No results found for any visits on 08/31/23.  The 10-year ASCVD risk  score (Arnett DK, et al., 2019) is: 5.7%    Assessment & Plan:  Essential hypertension Assessment & Plan: Chronic. Initial BP was 144/70 mmHg, repeat BP after patient was in clinic for about 15 min was 130/66 mmHg. not within goal. Will check CMP and TSH today. Continue Amlodipine  2.5 mg twice daily and Telmisartan  40 mg daily.   Orders: -     Comprehensive metabolic panel with GFR -     TSH -     Telmisartan ; Take 1 tablet (40 mg total) by mouth daily.  Dispense: 90 tablet; Refill: 3 -     amLODIPine  Besylate; TAKE 1 TABLET BY MOUTH IN THE  MORNING AND 1 TABLET BY MOUTH AT BEDTIME  Dispense: 180 tablet; Refill: 3  Hyperlipidemia, unspecified hyperlipidemia type Assessment & Plan: Chronic, Reviewed last lipid panel from 12/01/2021 which showed LDL  of 122.  Compliant on Lipitor 40 mg. I recommend rechecking lipid panel today.  If LDL continues to be high, will consider increasing Lipitor to 80 mg daily.  Lab ordered. Counseled on lifestyle modification including healthy diet, regular moderate intensity exercise for 30 min a day for 5 days a week of 150 min total in one week.     Orders: -     Lipid panel  Anxiety Assessment & Plan: PHQ9, GAD7 results reviewed with the patient. Symptoms stable on Zoloft  25 mg daily, continue. No HI/SI.    Prediabetes Assessment & Plan: Evident on lab from 12/01/21, A1c was 5.8% Repeat A1c today.  Discussed lifestyle modification as mentioned in hyperlipidemia plan.  Orders: -     Hemoglobin A1c  Murmur Assessment & Plan: Grade III systolic murmur, loudest at left second intercostal space.  Patient asymptomatic. D/D includes flow murmur, ASD, pulmonary HTN. No echo in patient's medical record.  Recommend getting echocardiogram to evaluate for structural abnormality.  Patient agreeable.  Orders: -     ECHOCARDIOGRAM COMPLETE; Future  Morbid obesity (HCC) Assessment & Plan: Patient has tried non-pharmacological intervention for weight  loss in the past.  Has co morbidities including HTN, hyperlipidemia, prediabetes, OSA.  Discussed various non pharmacological and pharmacological interventions for weight loss.  Patient denies a personal or family history of pancreatitis, medullary thyroid carcinoma or multiple endocrine neoplasia type II.  After discussing benefits/risks we will start patient on Wegovy 0.25 mg/0.5 ml once a week for 28 days.  Increase dose to 0.5 mg/0.5 ml once a week after that. Recommend OV in 6 weeks for weight check, medication tolerance.  Patient agreeable.     Orders: -     VITAMIN D  25 Hydroxy (Vit-D Deficiency, Fractures) -     ZOXWRU; Inject 0.25 mg into the skin once a week for 28 days.  Dispense: 2 mL; Refill: 0 -     Wegovy; Inject 0.5 mg into the skin once a week for 28 days.  Dispense: 2 mL; Refill: 0  Rash Assessment & Plan: Single scaly patch on right temporal aspect without hair loss, +itching. Recommend starting on Ketoconazole 2% shampoo twice a week. If symptoms persists recommend schedule an appointment.   Orders: -     Ketoconazole; Apply 1 Application topically 2 (two) times a week.  Dispense: 120 mL; Refill: 0  OSA on CPAP Assessment & Plan: Has been compliant with CPAP. Does not like current mask. Has not seen sleep specialist in a while. Recommend referral to sleep clinic. Patient agreeable.   Orders: -     Pulmonary Visit   I spent 45 minutes on the day of this face to face encounter reviewing patient's most recent visit with ob/gyn, prior relevant surgical and non surgical procedures, recent  labs and imaging studies, medications, counseling on HTN, weight loss, murmur, hyperlipidemia, OSA, reviewing the assessment and plan with patient, and post visit ordering and reviewing of  diagnostics, therapeutics and referral with patient .   Camri Molloy, MD  Return in about 5 weeks (around 10/05/2023) for 4-6 weeks for weight check, medication tolorance.   Jacklin Mascot, MD

## 2023-08-31 NOTE — Assessment & Plan Note (Signed)
 Single scaly patch on right temporal aspect without hair loss, +itching. Recommend starting on Ketoconazole 2% shampoo twice a week. If symptoms persists recommend schedule an appointment.

## 2023-08-31 NOTE — Telephone Encounter (Signed)
 Dr Casimir Cleaver is starting patient on Wegovy and wanted to send this message to initiate her prior authorization for the prescription. Please advise?

## 2023-08-31 NOTE — Assessment & Plan Note (Signed)
 Grade III systolic murmur, loudest at left second intercostal space.  Patient asymptomatic. D/D includes flow murmur, ASD, pulmonary HTN. No echo in patient's medical record.  Recommend getting echocardiogram to evaluate for structural abnormality.  Patient agreeable.

## 2023-08-31 NOTE — Assessment & Plan Note (Signed)
 Has been compliant with CPAP. Does not like current mask. Has not seen sleep specialist in a while. Recommend referral to sleep clinic. Patient agreeable.

## 2023-08-31 NOTE — Assessment & Plan Note (Addendum)
 Chronic. Initial BP was 144/70 mmHg, repeat BP after patient was in clinic for about 15 min was 130/66 mmHg. not within goal. Will check CMP and TSH today. Continue Amlodipine  2.5 mg twice daily and Telmisartan  40 mg daily.

## 2023-08-31 NOTE — Patient Instructions (Addendum)
--   We will get labs done today. If medication adjustment needs to be made based upon your lab results.  -- I recommend follow up around  28 days after you start weight loss medication.  -- We will start you on once a week, subcutaneous injection of Wegovy 0.25 mg for 28 days. Increase dose to 0.5 mg once a week for 28 days. I would want to see you in the clinic for a follow up visit in 4-6 weeks to see how you are tolerating the medication. -- I have ordered echocardiogram, if you do not hear to schedule an appointment for this within next 2 weeks please reach out to us .  -- I have sent a referral to our sleep clinic to follow up on OSA.

## 2023-08-31 NOTE — Assessment & Plan Note (Signed)
>>  ASSESSMENT AND PLAN FOR MORBID OBESITY (HCC) WRITTEN ON 08/31/2023 12:01 PM BY Shterna Laramee, MD  Patient has tried non-pharmacological intervention for weight loss in the past.  Has co morbidities including HTN, hyperlipidemia, prediabetes, OSA.  Discussed various non pharmacological and pharmacological interventions for weight loss.  Patient denies a personal or family history of pancreatitis, medullary thyroid carcinoma or multiple endocrine neoplasia type II.  After discussing benefits/risks we will start patient on Wegovy  0.25 mg/0.5 ml once a week for 28 days.  Increase dose to 0.5 mg/0.5 ml once a week after that. Recommend OV in 6 weeks for weight check, medication tolerance.  Patient agreeable.

## 2023-08-31 NOTE — Assessment & Plan Note (Addendum)
 Patient has tried non-pharmacological intervention for weight loss in the past.  Has co morbidities including HTN, hyperlipidemia, prediabetes, OSA.  Discussed various non pharmacological and pharmacological interventions for weight loss.  Patient denies a personal or family history of pancreatitis, medullary thyroid carcinoma or multiple endocrine neoplasia type II.  After discussing benefits/risks we will start patient on Wegovy 0.25 mg/0.5 ml once a week for 28 days.  Increase dose to 0.5 mg/0.5 ml once a week after that. Recommend OV in 6 weeks for weight check, medication tolerance.  Patient agreeable.

## 2023-08-31 NOTE — Assessment & Plan Note (Signed)
 PHQ9, GAD7 results reviewed with the patient. Symptoms stable on Zoloft  25 mg daily, continue. No HI/SI.

## 2023-08-31 NOTE — Assessment & Plan Note (Signed)
 Evident on lab from 12/01/21, A1c was 5.8% Repeat A1c today.  Discussed lifestyle modification as mentioned in hyperlipidemia plan.

## 2023-08-31 NOTE — Progress Notes (Signed)
 Pap Smear has been abstracted into patient's chart.

## 2023-08-31 NOTE — Assessment & Plan Note (Signed)
 Chronic, Reviewed last lipid panel from 12/01/2021 which showed LDL  of 122.  Compliant on Lipitor 40 mg. I recommend rechecking lipid panel today.  If LDL continues to be high, will consider increasing Lipitor to 80 mg daily.  Lab ordered. Counseled on lifestyle modification including healthy diet, regular moderate intensity exercise for 30 min a day for 5 days a week of 150 min total in one week.

## 2023-09-01 ENCOUNTER — Other Ambulatory Visit (HOSPITAL_COMMUNITY): Payer: Self-pay

## 2023-09-01 ENCOUNTER — Telehealth: Payer: Self-pay

## 2023-09-01 DIAGNOSIS — E66813 Obesity, class 3: Secondary | ICD-10-CM

## 2023-09-01 NOTE — Telephone Encounter (Signed)
 Noted.

## 2023-09-01 NOTE — Telephone Encounter (Signed)
 Received a fax notification from [facility] stating "Unfortunately, there are market disruptions reported from our wholesaler of [facility]. We are unable to fill the prescription for this medication. A new prescription should be directed to another pharmacy of the patient's choice."

## 2023-09-01 NOTE — Telephone Encounter (Signed)
 Gotcha! Ill call patient and see if she can provide me with pharmacy info. Thank you!

## 2023-09-01 NOTE — Telephone Encounter (Signed)
 Hey! I did and eligibility search for patient's pharmacy insurance coverage and checked patient's chart. I dont see any active insurance. Did she submit any info to doctor's office?

## 2023-09-01 NOTE — Telephone Encounter (Signed)
 I called patient to obtain pharmacy insurance information to complete prior auth for [facility]. I did an eligibilty check, searched media tab for insurance and reached out to clinic. LM for patient to call me back.

## 2023-09-02 LAB — COMPREHENSIVE METABOLIC PANEL WITH GFR
ALT: 16 IU/L (ref 0–32)
AST: 14 IU/L (ref 0–40)
Albumin: 3.9 g/dL (ref 3.8–4.9)
Alkaline Phosphatase: 101 IU/L (ref 44–121)
BUN/Creatinine Ratio: 14 (ref 9–23)
BUN: 12 mg/dL (ref 6–24)
Bilirubin Total: 0.3 mg/dL (ref 0.0–1.2)
CO2: 24 mmol/L (ref 20–29)
Calcium: 9.2 mg/dL (ref 8.7–10.2)
Chloride: 104 mmol/L (ref 96–106)
Creatinine, Ser: 0.88 mg/dL (ref 0.57–1.00)
Globulin, Total: 2.5 g/dL (ref 1.5–4.5)
Glucose: 98 mg/dL (ref 70–99)
Potassium: 4.1 mmol/L (ref 3.5–5.2)
Sodium: 141 mmol/L (ref 134–144)
Total Protein: 6.4 g/dL (ref 6.0–8.5)
eGFR: 78 mL/min/{1.73_m2} (ref >59)

## 2023-09-02 LAB — LIPID PANEL
Chol/HDL Ratio: 4.5 ratio — ABNORMAL HIGH (ref 0.0–4.4)
Cholesterol, Total: 177 mg/dL (ref 100–199)
HDL: 39 mg/dL — ABNORMAL LOW (ref >39)
LDL Chol Calc (NIH): 114 mg/dL — ABNORMAL HIGH (ref 0–99)
Triglycerides: 136 mg/dL (ref 0–149)
VLDL Cholesterol Cal: 24 mg/dL (ref 5–40)

## 2023-09-02 LAB — HEMOGLOBIN A1C
Est. average glucose Bld gHb Est-mCnc: 128 mg/dL
Hgb A1c MFr Bld: 6.1 % — ABNORMAL HIGH (ref 4.8–5.6)

## 2023-09-02 LAB — TSH: TSH: 0.897 u[IU]/mL (ref 0.450–4.500)

## 2023-09-02 LAB — VITAMIN D 25 HYDROXY (VIT D DEFICIENCY, FRACTURES): Vit D, 25-Hydroxy: 52.2 ng/mL (ref 30.0–100.0)

## 2023-09-02 MED ORDER — WEGOVY 0.5 MG/0.5ML ~~LOC~~ SOAJ
0.5000 mg | SUBCUTANEOUS | 0 refills | Status: AC
Start: 2023-09-02 — End: 2023-09-30

## 2023-09-02 MED ORDER — WEGOVY 0.25 MG/0.5ML ~~LOC~~ SOAJ: 0.2500 mg | SUBCUTANEOUS | 0 refills | Status: AC

## 2023-09-02 NOTE — Addendum Note (Signed)
 Addended by: Airis Barbee on: 09/02/2023 08:50 AM   Modules accepted: Orders

## 2023-09-02 NOTE — Telephone Encounter (Signed)
 Please reach out to the patient to let her know [facility] does not have Wegovy in stock. Please let the patient know I have sent the new prescription to [facility] pharmacy that is listed on patient's chart. Please also reach out to the prior auth team as well for [facility] prescription.   Thank you,  Jacklin Mascot, MD   1. Class 3 severe obesity with serious comorbidity and body mass index (BMI) of 45.0 to 49.9 in adult, unspecified obesity type (Primary) - Semaglutide-Weight Management (WEGOVY) 0.25 MG/0.5ML SOAJ; Inject 0.25 mg into the skin once a week for 28 days.  Dispense: 2 mL; Refill: 0 - Semaglutide-Weight Management (WEGOVY) 0.5 MG/0.5ML SOAJ; Inject 0.5 mg into the skin once a week for 28 days.  Dispense: 2 mL; Refill: 0

## 2023-09-02 NOTE — Progress Notes (Signed)
 Please let the patient know I reviewed her lab results and have the following recommendations:  - Her bad cholesterol or LDL looks better compared to her last visit from 2023. Her hemoglobin A1c (3 months of average blood glucose is slightly increased, still in prediabetic range. . I recommend continuing Atorvastatin  40 mg and adopting to heart-healthy lifestyle ( regular exercise, adding more vegetables, whole grains to your diet, try to limit red meat, processed foods, sugary drinks). I also anticipate with weight loss, whenever she can start [facility] can improve her blood glucose, cholesterol.  - Her liver, kidney function, thyroid hormone level, vitamin D  are all within normal range.   Thank you,  Jacklin Mascot, MD

## 2023-09-02 NOTE — Telephone Encounter (Signed)
 MyChart message has been sent to the patient to make her aware.

## 2023-09-03 NOTE — Progress Notes (Signed)
 Patient notified via MyChart of recent lab results.

## 2023-09-03 NOTE — Progress Notes (Signed)
 Noted.

## 2023-09-08 NOTE — Telephone Encounter (Signed)
 Clinical questions have been answered and PA submitted. PA currently Pending.  Key: Lael Pierce

## 2023-09-13 ENCOUNTER — Ambulatory Visit: Admitting: Sleep Medicine

## 2023-09-13 ENCOUNTER — Encounter: Payer: Self-pay | Admitting: Sleep Medicine

## 2023-09-13 VITALS — BP 120/60 | HR 77 | Temp 98.2°F | Ht 64.0 in | Wt 269.6 lb

## 2023-09-13 DIAGNOSIS — Z6841 Body Mass Index (BMI) 40.0 and over, adult: Secondary | ICD-10-CM

## 2023-09-13 DIAGNOSIS — G4733 Obstructive sleep apnea (adult) (pediatric): Secondary | ICD-10-CM | POA: Diagnosis not present

## 2023-09-13 DIAGNOSIS — I10 Essential (primary) hypertension: Secondary | ICD-10-CM

## 2023-09-13 NOTE — Telephone Encounter (Signed)
 Noted.  Jacklin Mascot, MD

## 2023-09-13 NOTE — Patient Instructions (Addendum)

## 2023-09-13 NOTE — Progress Notes (Signed)
 Name:Donna Hinton MRN: [id number] DOB: [birth year]   CHIEF COMPLAINT:  CPAP F/U   HISTORY OF PRESENT ILLNESS:  Donna Hinton is a 53 y.o. w/ a h/o OSA, HTN, hyperlipidemia and morbid obesity who presents to reestablish care for OSA. Reports using CPAP therapy every night, which is confirmed by compliance data. She is currently using a full face mask which often leaves marks on her face. Would like to try a smaller mask. Reports feeling more refreshed with CPAP therapy. Denies any daytime sleepiness. Reports nocturnal awakenings due to nocturia, however does not have difficulty falling back to sleep. Reports a 20 lb weight gain over the last year. Denies morning headaches, RLS symptoms, dream enactment, cataplexy, hypnagogic or hypnapompic hallucinations. Denies a family history of sleep apnea. Denies drowsy driving. Drinks 1 caffienated beverage daily. Denies alcohol use, tobacco or illicit drug use.   Bedtime 8 pm Sleep onset 30 mins Rise time 5:30 am   EPWORTH SLEEP SCORE 12    09/13/2023    8:00 AM 02/28/2019    1:00 PM  Results of the Epworth flowsheet  Sitting and reading 2 3  Watching TV 3 3  Sitting, inactive in a public place (e.g. a theatre or a meeting) 1 2  As a passenger in a car for an hour without a break 2 2  Lying down to rest in the afternoon when circumstances permit 3 3  Sitting and talking to someone 0 1  Sitting quietly after a lunch without alcohol 1 2  In a car, while stopped for a few minutes in traffic 0 0  Total score 12 16      PAST MEDICAL HISTORY :   has a past medical history of COVID-19, Heart murmur, and HTN (hypertension).  has a past surgical history that includes lsh; Abdominal hysterectomy; and Colonoscopy with propofol  (N/A, 08/25/2019). Prior to Admission medications   Medication Sig Start Date End Date Taking? Authorizing Provider  amLODipine  (NORVASC ) 2.5 MG tablet TAKE 1 TABLET BY MOUTH IN THE  MORNING AND 1 TABLET BY MOUTH AT  BEDTIME 08/31/23  Yes Bair, Kalpana, MD  atorvastatin  (LIPITOR) 40 MG tablet TAKE 1 TABLET BY MOUTH DAILY  AFTER SUPPER 01/26/23  Yes Valli Gaw, MD  Cholecalciferol  (VITAMIN D -3) 125 MCG (5000 UT) TABS Take by mouth daily at 12 noon.   Yes [provider]  ketoconazole  (NIZORAL ) 2 % shampoo Apply 1 Application topically 2 (two) times a week. 09/02/23  Yes Bair, Kalpana, MD  Semaglutide -Weight Management (WEGOVY ) 0.25 MG/0.5ML SOAJ Inject 0.25 mg into the skin once a week for 28 days. 09/02/23 09/30/23 Yes Bair, Kalpana, MD  Semaglutide -Weight Management (WEGOVY ) 0.5 MG/0.5ML SOAJ Inject 0.5 mg into the skin once a week for 28 days. 09/02/23 09/30/23 Yes Bair, Kalpana, MD  sertraline  (ZOLOFT ) 25 MG tablet TAKE 1 TABLET BY MOUTH IN THE  MORNING 02/16/23  Yes Valli Gaw, MD  telmisartan  (MICARDIS ) 40 MG tablet Take 1 tablet (40 mg total) by mouth daily. 08/31/23  Yes Bair, Kalpana, MD   Allergies  Allergen Reactions   Sulfa Antibiotics     Hives     FAMILY HISTORY:  family history includes Breast cancer (age of onset: 32) in her mother; Dementia in her mother; Diabetes in her cousin and father; Hypertension in her father and sister. SOCIAL HISTORY:  reports that she has never smoked. She has never used smokeless tobacco. She reports that she does not drink alcohol and does not  use drugs.   Review of Systems:  Gen:  Denies  fever, sweats, chills weight loss  HEENT: Denies blurred vision, double vision, ear pain, eye pain, hearing loss, nose bleeds, sore throat Cardiac:  No dizziness, chest pain or heaviness, chest tightness,edema, No JVD Resp:   No cough, -sputum production, -shortness of breath,-wheezing, -hemoptysis,  Gi: Denies swallowing difficulty, stomach pain, nausea or vomiting, diarrhea, constipation, bowel incontinence Gu:  Denies bladder incontinence, burning urine Ext:   Denies Joint pain, stiffness or swelling Skin: Denies  skin rash, easy bruising or bleeding or hives Endoc:   Denies polyuria, polydipsia , polyphagia or weight change Psych:   Denies depression, insomnia or hallucinations  Other:  All other systems negative  VITAL SIGNS: BP 120/60 (BP Location: Right Arm, Patient Position: Sitting, Cuff Size: Large)   Pulse 77   Temp 98.2 F (36.8 C) (Oral)   Ht 5\' 4"  (1.626 m)   Wt 269 lb 9.6 oz (122.3 kg)   SpO2 96%   BMI 46.28 kg/m    Physical Examination:   General Appearance: No distress  EYES PERRLA, EOM intact.   NECK Supple, No JVD Pulmonary: normal breath sounds, No wheezing.  CardiovascularNormal S1,S2.  No m/r/g.   Abdomen: Benign, Soft, non-tender. Skin:   warm, no rashes, no ecchymosis  Extremities: normal, no cyanosis, clubbing. Neuro:without focal findings,  speech normal  PSYCHIATRIC: Mood, affect within normal limits.   ASSESSMENT AND PLAN  OSA Patient is using and benefiting from CPAP therapy. Discussed the consequences of untreated sleep apnea. Will complete order for CPAP supplies and mask. Advised not to drive drowsy for safety of patient and others. Will follow up in 1 year.   HTN Stable, on current management. Following with PCP.   Morbid obesity Counseled patient on diet and lifestyle modification.    Patient  satisfied with Plan of action and management. All questions answered  I spent a total of 30 minutes reviewing chart data, face-to-face evaluation with the patient, counseling and coordination of care as detailed above.    Zacharias Ridling, M.D.  Sleep Medicine  Pulmonary & Critical Care Medicine

## 2023-09-13 NOTE — Telephone Encounter (Signed)
 Pharmacy Patient Advocate Encounter  Received notification from [facility] that Prior Authorization for  Wegovy  Inj 0.5mg  has been APPROVED from 09/08/2023 to 03/10/2024   PA #/Case ID/Reference #: [id number]

## 2023-09-29 ENCOUNTER — Encounter: Payer: Self-pay | Admitting: Family Medicine

## 2023-09-29 ENCOUNTER — Ambulatory Visit: Admitting: Family Medicine

## 2023-09-29 DIAGNOSIS — G4733 Obstructive sleep apnea (adult) (pediatric): Secondary | ICD-10-CM | POA: Diagnosis not present

## 2023-09-29 NOTE — Patient Instructions (Addendum)
 It was a pleasure meeting you today. Thank you for allowing me to take part in your health care.  Our goals for today as we discussed include:  Congratulations on your 4 pound weight loss Continue Wegovy  0.25 mg until completed Increase to 0.5 mg weekly.  If tolerating well after 3 weeks notify MD and can send in refill for increase dose.   This is a list of the screening recommended for you and due dates:  Health Maintenance  Topic Date Due   COVID-19 Vaccine (6 - 2024-25 season) 12/27/2022   Flu Shot  11/26/2023   Mammogram  07/28/2024   Pap with HPV screening  12/31/2024   DTaP/Tdap/Td vaccine (3 - Td or Tdap) 01/08/2029   Colon Cancer Screening  08/24/2029   Zoster (Shingles) Vaccine  Completed   HPV Vaccine  Aged Out   Meningitis B Vaccine  Aged Out   Hepatitis C Screening  Discontinued   HIV Screening  Discontinued      If you have any questions or concerns, please do not hesitate to call the office at [phone].  I look forward to our next visit and until then take care and stay safe.  Regards,   Valli Gaw, MD   [facility]

## 2023-09-29 NOTE — Progress Notes (Unsigned)
 SUBJECTIVE:   Chief Complaint  Patient presents with   Weight Check    4 week follow up   HPI Presents for acute visit  Discussed the use of AI scribe software for clinical note transcription with the patient, who gave verbal consent to proceed.  History of Present Illness Donna Hinton is a 53 year old female who presents for a weight check-in and medication follow-up.  She is currently on Wegovy  0.25 mg and is scheduled to increase the dose to 0.5 mg next week. Since starting the medication, she has lost four pounds and is using the fourth pen of her first box of 0.25 mg.  She has implemented dietary changes, reducing her intake of sweets and bread, and maintains a regular meal schedule with breakfast, lunch, and dinner.  She has a history of obstructive sleep apnea and uses a CPAP machine. No diabetes.     PERTINENT PMH / PSH: As above  OBJECTIVE:  BP 116/68   Pulse 67   Temp 98.3 F (36.8 C)   Resp 20   Ht 5\' 4"  (1.626 m)   Wt 265 lb 4 oz (120.3 kg)   SpO2 98%   BMI 45.53 kg/m    Physical Exam Vitals reviewed.  Constitutional:      General: She is not in acute distress.    Appearance: Normal appearance. She is obese. She is not ill-appearing, toxic-appearing or diaphoretic.  Eyes:     General:        Right eye: No discharge.        Left eye: No discharge.     Conjunctiva/sclera: Conjunctivae normal.  Cardiovascular:     Rate and Rhythm: Normal rate and regular rhythm.     Heart sounds: Normal heart sounds.  Pulmonary:     Effort: Pulmonary effort is normal.     Breath sounds: Normal breath sounds.  Abdominal:     General: Bowel sounds are normal.  Musculoskeletal:        General: Normal range of motion.  Skin:    General: Skin is warm and dry.  Neurological:     General: No focal deficit present.     Mental Status: She is alert and oriented to person, place, and time. Mental status is at baseline.  Psychiatric:        Mood and Affect: Mood  normal.        Behavior: Behavior normal.        Thought Content: Thought content normal.        Judgment: Judgment normal.           09/29/2023    8:52 AM 08/31/2023   10:58 AM 11/25/2021    8:41 AM 08/12/2021    9:12 AM 11/19/2020   11:20 AM  Depression screen PHQ 2/9  Decreased Interest 0 0 0 0 0  Down, Depressed, Hopeless 0 0 0 0 0  PHQ - 2 Score 0 0 0 0 0  Altered sleeping 0 0     Tired, decreased energy 0 0     Change in appetite 0 1     Feeling bad or failure about yourself  0 0     Trouble concentrating 0 0     Moving slowly or fidgety/restless 0 0     Suicidal thoughts 0 0     PHQ-9 Score 0 1     Difficult doing work/chores Not difficult at all Not difficult at all  09/29/2023    8:52 AM 08/31/2023   10:58 AM 07/18/2019    4:15 PM  GAD 7 : Generalized Anxiety Score  Nervous, Anxious, on Edge 0 0 0  Control/stop worrying 0 0 0  Worry too much - different things 0 0 0  Trouble relaxing 0 0 0  Restless 0 0 0  Easily annoyed or irritable 0 0 0  Afraid - awful might happen 0 0 0  Total GAD 7 Score 0 0 0  Anxiety Difficulty Not difficult at all Not difficult at all Not difficult at all    ASSESSMENT/PLAN:  Morbid obesity (HCC) Assessment & Plan: Achieved 4-pound weight loss on Wegovy  0.25 mg. Scheduled to start Wegovy  0.5 mg.  - Initiate Wegovy  0.5 mg weekly as previously prescribed. - Consider increasing Wegovy  to 1 mg in three weeks if tolerated. - Could consider Zepbound in future given history of OSA on CPAP    OSA on CPAP Assessment & Plan: Currently using CPAP therapy. Zepbound may be suitable alternative to Wegovy  for weight management with potential additional benefits. - Continue CPAP therapy. - Consider Zepbound in future to maximize weight loss    PDMP reviewed  Return for PCP.  Valli Gaw, MD

## 2023-10-01 ENCOUNTER — Encounter: Payer: Self-pay | Admitting: Family Medicine

## 2023-10-01 NOTE — Assessment & Plan Note (Signed)
 Currently using CPAP therapy. Zepbound may be suitable alternative to Wegovy  for weight management with potential additional benefits. - Continue CPAP therapy. - Consider Zepbound in future to maximize weight loss

## 2023-10-01 NOTE — Assessment & Plan Note (Signed)
>>  ASSESSMENT AND PLAN FOR MORBID OBESITY (HCC) WRITTEN ON 10/01/2023 10:23 AM BY WALSH, TANYA, MD  Achieved 4-pound weight loss on Wegovy  0.25 mg. Scheduled to start Wegovy  0.5 mg.  - Initiate Wegovy  0.5 mg weekly as previously prescribed. - Consider increasing Wegovy  to 1 mg in three weeks if tolerated. - Could consider Zepbound in future given history of OSA on CPAP

## 2023-10-01 NOTE — Assessment & Plan Note (Signed)
 Achieved 4-pound weight loss on Wegovy  0.25 mg. Scheduled to start Wegovy  0.5 mg.  - Initiate Wegovy  0.5 mg weekly as previously prescribed. - Consider increasing Wegovy  to 1 mg in three weeks if tolerated. - Could consider Zepbound in future given history of OSA on CPAP

## 2023-10-17 DIAGNOSIS — G4733 Obstructive sleep apnea (adult) (pediatric): Secondary | ICD-10-CM

## 2023-10-18 MED ORDER — WEGOVY 1 MG/0.5ML ~~LOC~~ SOAJ: 1.0000 mg | SUBCUTANEOUS | 1 refills | Status: DC

## 2023-10-18 NOTE — Telephone Encounter (Signed)
 Please reach out to the patient (congratulate on her weight loss) and to see if she is noticing any side effects eg- nausea, vomiting, abdominal pain. If she is not having any of these side effects okay to up dose to 1 mg once a week for the next 4-8 weeks. I would recommend OV in 8 weeks before we up her dose from 1 mg. I already sent the prescription to [facility].   1. Morbid obesity (HCC) (Primary) - Semaglutide -Weight Management (WEGOVY ) 1 MG/0.5ML SOAJ; Inject 1 mg into the skin once a week.  Dispense: 2 mL; Refill: 1  2. OSA on CPAP - Semaglutide -Weight Management (WEGOVY ) 1 MG/0.5ML SOAJ; Inject 1 mg into the skin once a week.  Dispense: 2 mL; Refill: 1   Thank you,  Luke Shade, MD

## 2023-10-19 MED ORDER — WEGOVY 1 MG/0.5ML ~~LOC~~ SOAJ: 1.0000 mg | SUBCUTANEOUS | 1 refills | Status: DC

## 2023-10-19 NOTE — Telephone Encounter (Signed)
 I sent the prescription to [facility] in [place].   1. Morbid obesity (HCC) (Primary) - Semaglutide -Weight Management (WEGOVY ) 1 MG/0.5ML SOAJ; Inject 1 mg into the skin once a week.  Dispense: 2 mL; Refill: 1  2. OSA on CPAP - Semaglutide -Weight Management (WEGOVY ) 1 MG/0.5ML SOAJ; Inject 1 mg into the skin once a week.  Dispense: 2 mL; Refill: 1   Kyley Solow, MD

## 2023-11-24 ENCOUNTER — Ambulatory Visit

## 2023-11-24 VITALS — BP 112/62 | HR 78 | Temp 98.4°F | Resp 20 | Ht 64.0 in | Wt 258.1 lb

## 2023-11-24 DIAGNOSIS — G4733 Obstructive sleep apnea (adult) (pediatric): Secondary | ICD-10-CM

## 2023-11-24 DIAGNOSIS — I10 Essential (primary) hypertension: Secondary | ICD-10-CM | POA: Diagnosis not present

## 2023-11-24 DIAGNOSIS — E66813 Obesity, class 3: Secondary | ICD-10-CM

## 2023-11-24 DIAGNOSIS — R7303 Prediabetes: Secondary | ICD-10-CM

## 2023-11-24 DIAGNOSIS — Z6841 Body Mass Index (BMI) 40.0 and over, adult: Secondary | ICD-10-CM

## 2023-11-24 MED ORDER — SEMAGLUTIDE-WEIGHT MANAGEMENT 2.4 MG/0.75ML ~~LOC~~ SOAJ: 2.4000 mg | SUBCUTANEOUS | 5 refills | Status: AC

## 2023-11-24 MED ORDER — SEMAGLUTIDE-WEIGHT MANAGEMENT 2.4 MG/0.75ML ~~LOC~~ SOAJ: 2.4000 mg | SUBCUTANEOUS | 5 refills | Status: DC

## 2023-11-24 MED ORDER — SEMAGLUTIDE-WEIGHT MANAGEMENT 1.7 MG/0.75ML ~~LOC~~ SOAJ: 1.7000 mg | SUBCUTANEOUS | 0 refills | Status: DC

## 2023-11-24 NOTE — Assessment & Plan Note (Addendum)
-  Goal weight less than 200 pounds, co morbidities: hyperlipidemia, hypertension, OSA, prediabetes.   - Morbid obesity with recent 3 kg weight loss with Wegovy  and lifestyle modifications.   - Discussed gradual dose increase to reduce risk of side effects.  - Increase Wegovy  from 1 mg once weekly to 1.7 mg weekly for 4 weeks, then 2.4 mg weekly as maintenance. Reach out to our clinic if s/e.  - Monitor for nausea, stomach pain, or pancreatitis signs. - Encourage diet and exercise: 30 minutes moderate intensity 5 days a week, strength training. - Ensure calorie deficit and nutritional intake with daily multivitamin. - F/U in 6 months, obtain CMP, A1c

## 2023-11-24 NOTE — Assessment & Plan Note (Addendum)
 BP within goal today, continue Amlodipine  2.5 mg once daily and Telmisartan  40 mg once daily. Check CMP during next visit.

## 2023-11-24 NOTE — Assessment & Plan Note (Signed)
 Check A1c during next OV

## 2023-11-24 NOTE — Progress Notes (Signed)
 Established Patient Office Visit   Subjective  Patient ID: MALERIE EAKINS, female    DOB: [birth year]  Age: 53 y.o. MRN: [id number]  Chief Complaint  Patient presents with   Weight Check    On WeGovy     She  has a past medical history of COVID-19, Heart murmur, and HTN (hypertension).  HPI Discussed the use of AI scribe software for clinical note transcription with the patient, who gave verbal consent to proceed.  History of Present Illness Morbid obesity:  In adjunct with regular exercise, healthy eating patient is on Wegovy  1 mg once weekly injectable for weight loss. She has been tolerating this dose well. Days she gets injection: Saturday. She denies nausea/vomiting, diarrhea, constipation, abdominal pain, dyspepsia, headache. Last OV on 09/29/23, weight was 120.3 kg. Weight today is 117.03 kg/258 lbs. Goal weight less than 200 lbs.   OSA: Last OV with me 08/31/23, saw sleep specialist Dr. Jess on 09/13/23. Recommended annual follow up. Is compliant with CPAP and uses it every night.   HTN: No chest pain, lower leg edema. Taking prescribed medication as recommended.    ROS As per HPI    Objective:     BP 112/62   Pulse 78   Temp 98.4 F (36.9 C)   Resp 20   Ht 5' 4 (1.626 m)   Wt 258 lb 2 oz (117.1 kg)   SpO2 98%   BMI 44.31 kg/m      11/24/2023    8:06 AM 09/29/2023    8:52 AM 08/31/2023   10:58 AM  Depression screen PHQ 2/9  Decreased Interest 0 0 0  Down, Depressed, Hopeless 0 0 0  PHQ - 2 Score 0 0 0  Altered sleeping 0 0 0  Tired, decreased energy 0 0 0  Change in appetite 0 0 1  Feeling bad or failure about yourself  0 0 0  Trouble concentrating 0 0 0  Moving slowly or fidgety/restless 0 0 0  Suicidal thoughts 0 0 0  PHQ-9 Score 0 0 1  Difficult doing work/chores Not difficult at all Not difficult at all Not difficult at all      11/24/2023    8:06 AM 09/29/2023    8:52 AM 08/31/2023   10:58 AM 07/18/2019    4:15 PM  GAD 7 : Generalized Anxiety Score   Nervous, Anxious, on Edge 0 0 0 0  Control/stop worrying 0 0 0 0  Worry too much - different things 0 0 0 0  Trouble relaxing 0 0 0 0  Restless 0 0 0 0  Easily annoyed or irritable 0 0 0 0  Afraid - awful might happen 0 0 0 0  Total GAD 7 Score 0 0 0 0  Anxiety Difficulty Not difficult at all Not difficult at all Not difficult at all Not difficult at all      11/24/2023    8:06 AM 09/29/2023    8:52 AM 08/31/2023   10:58 AM  Depression screen PHQ 2/9  Decreased Interest 0 0 0  Down, Depressed, Hopeless 0 0 0  PHQ - 2 Score 0 0 0  Altered sleeping 0 0 0  Tired, decreased energy 0 0 0  Change in appetite 0 0 1  Feeling bad or failure about yourself  0 0 0  Trouble concentrating 0 0 0  Moving slowly or fidgety/restless 0 0 0  Suicidal thoughts 0 0 0  PHQ-9 Score 0 0 1  Difficult doing  work/chores Not difficult at all Not difficult at all Not difficult at all      11/24/2023    8:06 AM 09/29/2023    8:52 AM 08/31/2023   10:58 AM 07/18/2019    4:15 PM  GAD 7 : Generalized Anxiety Score  Nervous, Anxious, on Edge 0 0 0 0  Control/stop worrying 0 0 0 0  Worry too much - different things 0 0 0 0  Trouble relaxing 0 0 0 0  Restless 0 0 0 0  Easily annoyed or irritable 0 0 0 0  Afraid - awful might happen 0 0 0 0  Total GAD 7 Score 0 0 0 0  Anxiety Difficulty Not difficult at all Not difficult at all Not difficult at all Not difficult at all   SDOH Screenings   Food Insecurity: No Food Insecurity (08/29/2023)  Housing: Low Risk  (08/29/2023)  Transportation Needs: No Transportation Needs (08/29/2023)  Depression (PHQ2-9): Low Risk  (11/24/2023)  Financial Resource Strain: Low Risk  (08/29/2023)  Physical Activity: Insufficiently Active (08/29/2023)  Social Connections: Moderately Integrated (08/29/2023)  Stress: No Stress Concern Present (08/29/2023)  Tobacco Use: Low Risk  (11/24/2023)   Filed Weights   11/24/23 0757  Weight: 258 lb 2 oz (117.1 kg)     Physical Exam Constitutional:       General: She is not in acute distress.    Appearance: Normal appearance. She is obese.  HENT:     Head: Normocephalic and atraumatic.     Mouth/Throat:     Mouth: Mucous membranes are moist.  Neck:     Thyroid: No thyroid mass or thyroid tenderness.  Cardiovascular:     Rate and Rhythm: Normal rate.  Pulmonary:     Effort: Pulmonary effort is normal.     Breath sounds: Normal breath sounds. No wheezing.  Abdominal:     General: Abdomen is protuberant. Bowel sounds are normal.     Palpations: Abdomen is soft.  Musculoskeletal:     Cervical back: Neck supple. No rigidity.     Right lower leg: No edema.     Left lower leg: No edema.  Lymphadenopathy:     Cervical: No cervical adenopathy.  Skin:    General: Skin is warm.  Neurological:     Mental Status: She is alert and oriented to person, place, and time.     Gait: Gait normal.  Psychiatric:        Mood and Affect: Mood normal.        Behavior: Behavior normal.        No results found for any visits on 11/24/23.  The 10-year ASCVD risk score (Arnett DK, et al., 2019) is: 3.9%     Assessment & Plan:  Patient is a pleasant 53 year old female presenting for   Class 3 severe obesity with body mass index (BMI) of 40.0 to 44.9 in adult Assessment & Plan: -Goal weight less than 200 pounds, co morbidities: hyperlipidemia, hypertension, OSA, prediabetes.   - Morbid obesity with recent 3 kg weight loss with Wegovy  and lifestyle modifications.   - Discussed gradual dose increase to reduce risk of side effects.  - Increase Wegovy  from 1 mg once weekly to 1.7 mg weekly for 4 weeks, then 2.4 mg weekly as maintenance. Reach out to our clinic if s/e.  - Monitor for nausea, stomach pain, or pancreatitis signs. - Encourage diet and exercise: 30 minutes moderate intensity 5 days a week, strength training. - Ensure calorie  deficit and nutritional intake with daily multivitamin. - F/U in 6 months, obtain CMP, A1c   Orders: -      Semaglutide -Weight Management; Inject 1.7 mg into the skin once a week.  Dispense: 3 mL; Refill: 0 -     Semaglutide -Weight Management; Inject 2.4 mg into the skin once a week.  Dispense: 3 mL; Refill: 5  OSA on CPAP Assessment & Plan: Compliant with CPAP, continue annual f/u with sleep specialist.    Essential hypertension Assessment & Plan: BP within goal today, continue Amlodipine  2.5 mg once daily and Telmisartan  40 mg once daily. Check CMP during next visit.   Orders: -     Comprehensive metabolic panel with GFR; Future  Prediabetes Assessment & Plan: Check A1c during next OV  Orders: -     Hemoglobin A1c; Future   Return in about 6 months (around 05/26/2024) for chronic follow up .   Luke Shade, MD

## 2023-11-24 NOTE — Patient Instructions (Addendum)
 You qualify for hepatitis B immunization. Please call your insurance or check with local pharmacy to update this.   We are increasing the dose of Wegovy  from 1 mg once weekly injection to 1.7 mg once weekly for 4 weeks, then 2.4 mg once weekly after that. 2.4 mg once weekly is the maximum dose of Wegovy . Please let us  know if you were to develop side effects like nausea, vomiting, stomach pain. We will have to reduce dose if side effects occurs.   Diet: Emphasize whole grains, lean proteins, fruits, and vegetables. Limit processed foods and sugary drinks.  Exercise: Aim for 150 minutes of moderate aerobic activity weekly plus strength training twice a week.  Take daily multivitamin if you are not already doing so.

## 2023-11-24 NOTE — Assessment & Plan Note (Signed)
 Compliant with CPAP, continue annual f/u with sleep specialist.

## 2023-11-25 ENCOUNTER — Other Ambulatory Visit: Payer: Self-pay

## 2023-11-25 DIAGNOSIS — F419 Anxiety disorder, unspecified: Secondary | ICD-10-CM

## 2023-11-25 MED ORDER — SERTRALINE HCL 25 MG PO TABS
25.0000 mg | ORAL_TABLET | Freq: Every morning | ORAL | 3 refills | Status: AC
Start: 2023-11-25 — End: ?

## 2023-12-09 ENCOUNTER — Other Ambulatory Visit: Payer: Self-pay

## 2023-12-09 DIAGNOSIS — E785 Hyperlipidemia, unspecified: Secondary | ICD-10-CM

## 2023-12-09 MED ORDER — ATORVASTATIN CALCIUM 40 MG PO TABS: 40.0000 mg | ORAL_TABLET | Freq: Every day | ORAL | 5 refills | Status: AC

## 2024-01-12 ENCOUNTER — Ambulatory Visit: Payer: Self-pay

## 2024-01-12 ENCOUNTER — Ambulatory Visit
Admission: RE | Admit: 2024-01-12 | Discharge: 2024-01-12 | Disposition: A | Discharge status: Home / Self Care | Source: Ambulatory Visit

## 2024-01-12 DIAGNOSIS — R011 Cardiac murmur, unspecified: Secondary | ICD-10-CM | POA: Diagnosis present

## 2024-01-12 DIAGNOSIS — I080 Rheumatic disorders of both mitral and aortic valves: Secondary | ICD-10-CM | POA: Diagnosis not present

## 2024-01-12 DIAGNOSIS — I5189 Other ill-defined heart diseases: Secondary | ICD-10-CM | POA: Insufficient documentation

## 2024-01-12 DIAGNOSIS — I350 Nonrheumatic aortic (valve) stenosis: Secondary | ICD-10-CM | POA: Insufficient documentation

## 2024-01-12 LAB — ECHOCARDIOGRAM COMPLETE
AR max vel: 4.83 cm2
AV Area VTI: 5.83 cm2
AV Area mean vel: 4.68 cm2
AV Mean grad: 27 mmHg
AV Peak grad: 47.8 mmHg
Ao pk vel: 3.46 m/s
Area-P 1/2: 4.08 cm2
MV VTI: 13.69 cm2
S' Lateral: 3.4 cm

## 2024-01-12 NOTE — Telephone Encounter (Signed)
 Updated patient on her echo result from 01/12/24  1. Nonrheumatic aortic valve stenosis (Primary) - Ambulatory referral to Cardiology -  Given moderate aortic stenosis recommend repeat echo cardiogram every 2 years to evaluate EF, progression of disease or sooner if symptoms eg- chest pain, shortness of breath, palpitations, dizziness occurs.  No further treatment recommended at this time. Mutual decision made to refer patient to cardiology for recommendations on f/u repeat echo. Cardiology referral made.   2. Grade I diastolic dysfunction - Ambulatory referral to Cardiology - Grade I diastolic dysfunction, moderate aortic stenosis. Patient is asymptomatic. Discussed management of co-morbidities including BP control, weight management cholesterol management.  Luke Shade, MD

## 2024-01-12 NOTE — Progress Notes (Signed)
*  PRELIMINARY RESULTS* Echocardiogram 2D Echocardiogram has been performed.  Donna Hinton 01/12/2024, 10:50 AM

## 2024-01-12 NOTE — Progress Notes (Signed)
 Updated patient on her echo result:  Grade I diastolic dysfunction, moderate aortic stenosis. Patient is asymptomatic. Discussed management of co-morbidities including BP control, weight management cholesterol management. Given moderate aortic stenosis recommend repeat echo cardiogram every 2 years to evaluate EF, progression of disease or sooner if symptoms eg- chest pain, shortness of breath, palpitations, dizziness occurs.  No further treatment recommended at this time. Mutual decision made to refer patient to cardiology for recommendations on f/u repeat echo. Cardiology referral made.   Luke Shade, MD

## 2024-01-14 ENCOUNTER — Ambulatory Visit: Attending: Cardiovascular Disease | Admitting: Cardiovascular Disease

## 2024-01-14 ENCOUNTER — Encounter: Payer: Self-pay | Admitting: Cardiovascular Disease

## 2024-01-14 VITALS — BP 126/74 | HR 75 | Ht 64.0 in | Wt 253.6 lb

## 2024-01-14 DIAGNOSIS — I350 Nonrheumatic aortic (valve) stenosis: Secondary | ICD-10-CM

## 2024-01-14 MED ORDER — METOPROLOL SUCCINATE ER 25 MG PO TB24: 12.5000 mg | ORAL_TABLET | Freq: Every day | ORAL | 3 refills | Status: AC

## 2024-01-14 NOTE — Patient Instructions (Signed)
Medication Instructions:  Please start metoprolol succinate 12.5 mg daily  If you need a refill on your cardiac medications before your next appointment, please call your pharmacy.   Lab work: No new labs needed  Testing/Procedures: No new testing needed  Follow-Up: At [facility], you and your health needs are our priority.  As part of our continuing mission to provide you with exceptional heart care, we have created designated Provider Care Teams.  These Care Teams include your primary Cardiologist (physician) and Advanced Practice Providers (APPs -  Physician Assistants and Nurse Practitioners) who all work together to provide you with the care you need, when you need it.  You will need a follow up appointment in 12 months  Providers on your designated Care Team:   Nicolasa Ducking, NP Eula Listen, PA-C Cadence Fransico Michael, [place]  COVID-19 Vaccine Information can be found at: [email or web address] For questions related to vaccine distribution or appointments, please email [email or web address] or call [phone].

## 2024-01-14 NOTE — Progress Notes (Signed)
 Cardiology Office Note  Date:  01/14/2024   ID:  Donna Hinton, DOB Aug 07, 1970, MRN [id number]  PCP:  Donna Bruckner, MD   Cc: Left ventricular outflow tract obstruction on echo  HPI:  Donna Hinton is a 53 y.o. femalewith past medical history of: Sleep apnea on CPAP Essential hypertension Aortic valve stenosis Who presents by referral from Dr. Abbey for consultation of her left ventricular outflow tract obstruction on echo, HTN  On discussion today, reports that she was seen by primary care  murmur was appreciated  She denies any symptoms of shortness of breath or chest discomfort on exertion, no near syncope, dizziness, no syncope history  Echocardiogram January 12, 2024 for murmur Echo shows moderate LVH Outflow tract obstruction, mean gradient 27 mmHg, peak gradient 48 mmHg Aortic valve not well-visualized but appears to have been cusps without significant calcification or valve pathology  Works at [employer], active  Lab work reviewed A1c 6.1 Total cholesterol 177 LDL 114  EKG personally reviewed by myself on todays visit EKG Interpretation Date/Time:  Friday January 14 2024 09:52:22 EDT Ventricular Rate:  71 PR Interval:  170 QRS Duration:  104 QT Interval:  438 QTC Calculation: 475 R Axis:   3  Text Interpretation: Normal sinus rhythm Moderate voltage criteria for LVH, may be normal variant ( R in aVL , Cornell product ) Nonspecific T wave abnormality Prolonged QT No previous ECGs available Confirmed by Donna Hinton [phone]) on 01/14/2024 9:59:21 AM    PMH:   has a past medical history of COVID-19, Heart murmur, and HTN (hypertension).  PSH:    Past Surgical History:  Procedure Laterality Date   ABDOMINAL HYSTERECTOMY     ovaries intact had 2011/2012 due to fibroids/DUB   COLONOSCOPY WITH PROPOFOL  N/A 08/25/2019   Procedure: COLONOSCOPY WITH PROPOFOL ;  Surgeon: Donna Bi, MD;  Location: [facility] ENDOSCOPY;  Service: Gastroenterology;  Laterality: N/A;   PRIORITY 4   lsh      Current Outpatient Medications  Medication Sig Dispense Refill   amLODipine  (NORVASC ) 2.5 MG tablet TAKE 1 TABLET BY MOUTH IN THE  MORNING AND 1 TABLET BY MOUTH AT BEDTIME 180 tablet 3   atorvastatin  (LIPITOR) 40 MG tablet Take 1 tablet (40 mg total) by mouth daily. 30 tablet 5   Cholecalciferol  (VITAMIN D -3) 125 MCG (5000 UT) TABS Take by mouth daily at 12 noon.     ketoconazole  (NIZORAL ) 2 % shampoo Apply 1 Application topically 2 (two) times a week. 120 mL 0   Semaglutide -Weight Management 1.7 MG/0.75ML SOAJ Inject 1.7 mg into the skin once a week. 3 mL 0   Semaglutide -Weight Management 2.4 MG/0.75ML SOAJ Inject 2.4 mg into the skin once a week. 3 mL 5   sertraline  (ZOLOFT ) 25 MG tablet Take 1 tablet (25 mg total) by mouth every morning. 90 tablet 3   telmisartan  (MICARDIS ) 40 MG tablet Take 1 tablet (40 mg total) by mouth daily. 90 tablet 3   No current facility-administered medications for this visit.     Allergies:   Sulfa antibiotics   Social History:  The patient  reports that she has never smoked. She has never used smokeless tobacco. She reports that she does not drink alcohol and does not use drugs.   Family History:   family history includes Breast cancer (age of onset: 65) in her mother; Dementia in her mother; Diabetes in her cousin and father; Hypertension in her father and sister.    Review of Systems: Review of Systems  Constitutional: Negative.   HENT: Negative.    Respiratory: Negative.    Cardiovascular: Negative.   Gastrointestinal: Negative.   Musculoskeletal: Negative.   Neurological: Negative.   Psychiatric/Behavioral: Negative.    All other systems reviewed and are negative.   PHYSICAL EXAM: VS:  BP 126/74   Pulse 75   Ht 5' 4 (1.626 m)   Wt 253 lb 9.6 oz (115 kg)   SpO2 93%   BMI 43.53 kg/m  , BMI Body mass index is 43.53 kg/m. GEN: Well nourished, well developed, in no acute distress HEENT: normal Neck: no JVD,  carotid bruits, or masses Cardiac: RRR; 2/6 SEM RSB, no rubs, or gallops,no edema  Respiratory:  clear to auscultation bilaterally, normal work of breathing GI: soft, nontender, nondistended, + BS MS: no deformity or atrophy Skin: warm and dry, no rash Neuro:  Strength and sensation are intact Psych: euthymic mood, full affect    Recent Labs: 08/31/2023: ALT 16; BUN 12; Creatinine, Ser 0.88; Potassium 4.1; Sodium 141; TSH 0.897    Lipid Panel Lab Results  Component Value Date   CHOL 177 08/31/2023   HDL 39 (L) 08/31/2023   LDLCALC 114 (H) 08/31/2023   TRIG 136 08/31/2023      Wt Readings from Last 3 Encounters:  01/14/24 253 lb 9.6 oz (115 kg)  11/24/23 258 lb 2 oz (117.1 kg)  09/29/23 265 lb 4 oz (120.3 kg)       ASSESSMENT AND PLAN:  Problem List Items Addressed This Visit       Cardiology Problems   Nonrheumatic aortic valve stenosis - Primary   Relevant Orders   EKG 12-Lead (Completed)   LVH with outflow tract obstruction Mean gradient 27 mmHg on echo With Valsalva up to 90 mmHg Images pulled up and reviewed on today's visit Incidental finding based on murmur, reports that she is asymptomatic, blood pressure well-controlled -Recommend we start metoprolol  succinate 12.5 daily - Continue aggressive blood pressure control - Stay hydrated - Precautions discussed including dehydration, rising quickly from a supine position, avoid heavy lifting, Valsalva - For any new symptoms recommend she call our office - Repeat echo could be ordered in 2 years time - We can see her on an annual basis to ensure no new symptoms - Will continue amlodipine , telmisartan  - For worsening symptoms, would refer to HOC[facility] clinic in [facility]  Hyperlipidemia Tolerating Lipitor  Essential hypertension Blood pressure is well controlled on today's visit.  Metoprolol  succinate 12.5 daily added  Ms. Donna Hinton was seen in consultation for Dr. Abbey and will be referred back to her office  for ongoing care of the issues detailed aboveir   Signed, Donna Hinton, M.D., Ph.D. [facility] [place], [place] [phone]

## 2024-02-24 ENCOUNTER — Other Ambulatory Visit (HOSPITAL_COMMUNITY): Payer: Self-pay

## 2024-05-26 ENCOUNTER — Ambulatory Visit

## 2024-06-02 ENCOUNTER — Ambulatory Visit

## 2024-06-02 VITALS — BP 128/62 | HR 70 | Temp 98.4°F | Ht 64.0 in | Wt 249.2 lb

## 2024-06-02 DIAGNOSIS — E782 Mixed hyperlipidemia: Secondary | ICD-10-CM

## 2024-06-02 DIAGNOSIS — R011 Cardiac murmur, unspecified: Secondary | ICD-10-CM

## 2024-06-02 DIAGNOSIS — R7989 Other specified abnormal findings of blood chemistry: Secondary | ICD-10-CM | POA: Insufficient documentation

## 2024-06-02 DIAGNOSIS — Z23 Encounter for immunization: Secondary | ICD-10-CM | POA: Insufficient documentation

## 2024-06-02 DIAGNOSIS — R7303 Prediabetes: Secondary | ICD-10-CM

## 2024-06-02 DIAGNOSIS — F419 Anxiety disorder, unspecified: Secondary | ICD-10-CM

## 2024-06-02 DIAGNOSIS — E66813 Obesity, class 3: Secondary | ICD-10-CM

## 2024-06-02 DIAGNOSIS — G4733 Obstructive sleep apnea (adult) (pediatric): Secondary | ICD-10-CM

## 2024-06-02 DIAGNOSIS — I10 Essential (primary) hypertension: Secondary | ICD-10-CM

## 2024-06-02 DIAGNOSIS — I350 Nonrheumatic aortic (valve) stenosis: Secondary | ICD-10-CM

## 2024-06-02 DIAGNOSIS — Z1231 Encounter for screening mammogram for malignant neoplasm of breast: Secondary | ICD-10-CM

## 2024-06-02 DIAGNOSIS — Q248 Other specified congenital malformations of heart: Secondary | ICD-10-CM | POA: Insufficient documentation

## 2024-06-02 NOTE — Assessment & Plan Note (Addendum)
 Weight was 258 pounds on 11/24/2023 versus 249 pounds today. Goal weight 200 pounds or less.  Continue Wegovy  2.4 mg weekly.  Tolerating well.  Continue healthy eating (Emphasize whole grains, lean proteins, fruits, and vegetables. Limit processed foods and sugary drinks, exercise: 150 minutes of moderate aerobic activity weekly plus strength training twice a week. F/U in 6 months.

## 2024-06-02 NOTE — Assessment & Plan Note (Addendum)
 Noted on echocardiogram on 01/12/2024.  Has seen cardiologist Dr. Gollan since echo (01/14/24).  Blood pressure within goal.  Recommend annual cardiology follow-up.  Patient asymptomatic.  Blood pressure well-controlled.  Recommend avoiding dehydration.  Recommend seeking medical care if she develops dizziness, chest pain, shortness of breath.

## 2024-06-02 NOTE — Assessment & Plan Note (Addendum)
 Updated today.  Orders:   Pneumococcal conjugate vaccine 20-valent (Prevnar 20)

## 2024-06-02 NOTE — Assessment & Plan Note (Addendum)
 Continue atorvastatin  40 mg, check lipid panel, CMP today.  Lab ordered. Orders:   Lipid panel   Comp Met (CMET)

## 2024-06-02 NOTE — Assessment & Plan Note (Addendum)
 Known history of prediabetes, check A1c. Orders:   HgB A1c

## 2024-06-02 NOTE — Assessment & Plan Note (Addendum)
 H/O mildly elevated lymphocytes with stable CBC. Previously saw hematologist Dr. Melanee.  No fever, chills, fatigue, unintentional weight loss.  Will continue to monitor CBC intermittently. Orders:   CBC w/Diff

## 2024-06-02 NOTE — Assessment & Plan Note (Addendum)
 Compliant with CPAP, continue annual f/u with sleep specialist.

## 2024-06-02 NOTE — Assessment & Plan Note (Addendum)
 Patient asymptomatic.  Blood pressure within goal.  Known h/o aortic stenosis ( echo last 01/11/25). Grade II systolic murmur, loudest at R second ICS space.  Recommend seeking medical care if she develops dizziness, chest pain, shortness of breath.

## 2024-06-02 NOTE — Assessment & Plan Note (Addendum)
 Due for screening mammogram, mammogram ordered.  Patient provided with phone number to call to schedule for an appointment. Orders:   MM 3D SCREENING MAMMOGRAM BILATERAL BREAST; Future

## 2024-06-02 NOTE — Assessment & Plan Note (Addendum)
 Blood pressure within goal with amlodipine  2.5 mg, metoprolol  succinate 12.5 mg and telmisartan  40 mg daily.  Continue.  Check urine microalbumin and CMP today. Orders:   Urine Microalbumin w/creat. ratio   Comp Met (CMET)

## 2024-06-02 NOTE — Assessment & Plan Note (Addendum)
 PHQ-9, GAD-7 reviewed.  No SI/HI.  Stable on Zoloft  25 mg daily. Continue.

## 2024-06-02 NOTE — Patient Instructions (Addendum)
 YOUR MAMMOGRAM IS DUE, PLEASE CALL AND GET THIS SCHEDULED! [facility] - call [phone]

## 2024-06-02 NOTE — Progress Notes (Signed)
 "  Established Patient Office Visit   Subjective  Patient ID: Donna Hinton, female    DOB: [birth year]  Age: 54 y.o. MRN: [id number]  Chief Complaint  Patient presents with   Hypertension   Sleep Apnea    Discussed the use of AI scribe software for clinical note transcription with the patient, who gave verbal consent to proceed.  History of Present Illness Donna Hinton is a 54 year old female with hypertension, aortic stenosis, cardiac murmur, hyperlipidemia, obesity, anxiety and sleep apnea who presents for a follow-up visit.  Since her last visit with me in 11/24/2023 patient has undergone echocardiogram diagnosed with nonrheumatic aortic valve stenosis/moderate with echocardiogram showing LVOT.  She was started on metoprolol  succinate 12.5 mg daily for better blood pressure control.  She is tolerating this well.  She also continues to take amlodipine  2.5 mg and telmisartan  40 mg daily.  She is on atorvastatin  40 mg daily for cholesterol management.  Mood has been stable on sertraline  25 mg daily.  Has a history of vitamin D  deficiency and takes vitamin D  5000 units daily.   She feels good overall.  Currently on Wegovy  2.4 mg weekly injection.  Has noted weight loss, no side effects reported.  She exercises regularly, although she slacked off during the holidays but is getting back to her routine. She plans to return to work today.  She uses CPAP daily for OSA. She received a flu shot at work, although it is not showing in her records. She has had COVID vaccinations. Her last mammogram was in April 2024.  She denies chest pain, dizziness, palpitations, lower leg edema.      ROS As per HPI    Objective:     BP 128/62 (BP Location: Right Arm, Patient Position: Sitting)   Pulse 70   Temp 98.4 F (36.9 C) (Oral)   Ht 5' 4 (1.626 m)   Wt 249 lb 3.2 oz (113 kg)   SpO2 96%   BMI 42.78 kg/m      06/02/2024    9:16 AM 11/24/2023    8:06 AM 09/29/2023    8:52 AM  Depression  screen PHQ 2/9  Decreased Interest 0 0 0  Down, Depressed, Hopeless 0 0 0  PHQ - 2 Score 0 0 0  Altered sleeping 0 0 0  Tired, decreased energy 0 0 0  Change in appetite 0 0 0  Feeling bad or failure about yourself  0 0 0  Trouble concentrating 0 0 0  Moving slowly or fidgety/restless 0 0 0  Suicidal thoughts 0 0 0  PHQ-9 Score 0 0  0   Difficult doing work/chores Not difficult at all Not difficult at all Not difficult at all     Data saved with a previous flowsheet row definition      06/02/2024    9:16 AM 11/24/2023    8:06 AM 09/29/2023    8:52 AM 08/31/2023   10:58 AM  GAD 7 : Generalized Anxiety Score  Nervous, Anxious, on Edge 0 0  0  0   Control/stop worrying 0 0  0  0   Worry too much - different things 0 0  0  0   Trouble relaxing 0 0  0  0   Restless 0 0  0  0   Easily annoyed or irritable 1 0  0  0   Afraid - awful might happen 0 0  0  0   Total GAD  7 Score 1 0 0 0  Anxiety Difficulty Not difficult at all Not difficult at all Not difficult at all Not difficult at all     Data saved with a previous flowsheet row definition      06/02/2024    9:16 AM 11/24/2023    8:06 AM 09/29/2023    8:52 AM  Depression screen PHQ 2/9  Decreased Interest 0 0 0  Down, Depressed, Hopeless 0 0 0  PHQ - 2 Score 0 0 0  Altered sleeping 0 0 0  Tired, decreased energy 0 0 0  Change in appetite 0 0 0  Feeling bad or failure about yourself  0 0 0  Trouble concentrating 0 0 0  Moving slowly or fidgety/restless 0 0 0  Suicidal thoughts 0 0 0  PHQ-9 Score 0 0  0   Difficult doing work/chores Not difficult at all Not difficult at all Not difficult at all     Data saved with a previous flowsheet row definition      06/02/2024    9:16 AM 11/24/2023    8:06 AM 09/29/2023    8:52 AM 08/31/2023   10:58 AM  GAD 7 : Generalized Anxiety Score  Nervous, Anxious, on Edge 0 0  0  0   Control/stop worrying 0 0  0  0   Worry too much - different things 0 0  0  0   Trouble relaxing 0 0  0  0    Restless 0 0  0  0   Easily annoyed or irritable 1 0  0  0   Afraid - awful might happen 0 0  0  0   Total GAD 7 Score 1 0 0 0  Anxiety Difficulty Not difficult at all Not difficult at all Not difficult at all Not difficult at all     Data saved with a previous flowsheet row definition   SDOH Screenings   Food Insecurity: No Food Insecurity (06/01/2024)  Housing: Low Risk (06/01/2024)  Transportation Needs: No Transportation Needs (06/01/2024)  Depression (PHQ2-9): Low Risk (06/02/2024)  Financial Resource Strain: Low Risk (06/01/2024)  Physical Activity: Insufficiently Active (06/01/2024)  Social Connections: Moderately Integrated (06/01/2024)  Stress: No Stress Concern Present (06/01/2024)  Tobacco Use: Low Risk (06/02/2024)     Physical Exam Constitutional:      General: She is not in acute distress.    Appearance: She is obese.  HENT:     Head: Normocephalic and atraumatic.     Mouth/Throat:     Mouth: Mucous membranes are moist.  Cardiovascular:     Rate and Rhythm: Normal rate.     Heart sounds: Murmur heard.  Pulmonary:     Effort: Pulmonary effort is normal.     Breath sounds: Normal breath sounds.  Abdominal:     Palpations: Abdomen is soft.     Tenderness: There is no abdominal tenderness. There is no guarding.  Musculoskeletal:     Cervical back: No tenderness.     Right lower leg: No edema.     Left lower leg: No edema.  Skin:    General: Skin is warm.  Neurological:     Mental Status: She is alert and oriented to person, place, and time.  Psychiatric:        Mood and Affect: Mood normal.        Behavior: Behavior normal.        No results found for any visits on 06/02/24.  The  10-year ASCVD risk score (Arnett DK, et al., 2019) is: 6.1%     Assessment & Plan:  Patient is a pleasant 54 year old female presenting for chronic disease management.  She updated influenza immunization through work.  She has a history of vitamin D  deficiency for which she takes  daily vitamin D  5000 units. Assessment & Plan Essential hypertension Blood pressure within goal with amlodipine  2.5 mg, metoprolol  succinate 12.5 mg and telmisartan  40 mg daily.  Continue.  Check urine microalbumin and CMP today. Orders:   Urine Microalbumin w/creat. ratio   Comp Met (CMET)  Mixed hyperlipidemia Continue atorvastatin  40 mg, check lipid panel, CMP today.  Lab ordered. Orders:   Lipid panel   Comp Met (CMET)  Prediabetes Known history of prediabetes, check A1c. Orders:   HgB A1c  Screening mammogram for breast cancer Due for screening mammogram, mammogram ordered.  Patient provided with phone number to call to schedule for an appointment. Orders:   MM 3D SCREENING MAMMOGRAM BILATERAL BREAST; Future  Abnormal CBC H/O mildly elevated lymphocytes with stable CBC. Previously saw hematologist Dr. Melanee.  No fever, chills, fatigue, unintentional weight loss.  Will continue to monitor CBC intermittently. Orders:   CBC w/Diff  Need for prophylactic vaccination with Streptococcus pneumoniae (Pneumococcus) and Influenza vaccines Updated today. Orders:   Pneumococcal conjugate vaccine 20-valent (Prevnar 20)  Anxiety PHQ-9, GAD-7 reviewed.  No SI/HI.  Stable on Zoloft  25 mg daily. Continue.     Class 3 severe obesity with serious comorbidity and body mass index (BMI) of 40.0 to 44.9 in adult, unspecified obesity type (HCC) Weight was 258 pounds on 11/24/2023 versus 249 pounds today. Goal weight 200 pounds or less.  Continue Wegovy  2.4 mg weekly.  Tolerating well.  Continue healthy eating (Emphasize whole grains, lean proteins, fruits, and vegetables. Limit processed foods and sugary drinks, exercise: 150 minutes of moderate aerobic activity weekly plus strength training twice a week. F/U in 6 months.      Nonrheumatic aortic valve stenosis Patient asymptomatic.  Blood pressure within goal.  Known h/o aortic stenosis ( echo last 01/11/25). Grade II systolic murmur,  loudest at R second ICS space.  Recommend seeking medical care if she develops dizziness, chest pain, shortness of breath.       Left ventricular outflow tract obstruction Noted on echocardiogram on 01/12/2024.  Has seen cardiologist Dr. Gollan since echo (01/14/24).  Blood pressure within goal.  Recommend annual cardiology follow-up.  Patient asymptomatic.  Blood pressure well-controlled.  Recommend avoiding dehydration.  Recommend seeking medical care if she develops dizziness, chest pain, shortness of breath.      Murmur Patient asymptomatic.  Blood pressure within goal.  Known h/o aortic stenosis ( echo last 01/11/25). Grade II systolic murmur, loudest at R second ICS space.  Recommend seeking medical care if she develops dizziness, chest pain, shortness of breath.      OSA on CPAP Compliant with CPAP, continue annual f/u with sleep specialist.      Return in about 6 months (around 11/30/2024) for chronic follow up .   Luke Shade, MD "

## 2024-06-23 ENCOUNTER — Encounter

## 2024-11-30 ENCOUNTER — Ambulatory Visit
